# Patient Record
Sex: Male | Born: 1975 | Race: White | Hispanic: No | Marital: Single | Smoking: Current every day smoker
Health system: Southern US, Community
[De-identification: ages and names within clinical notes are randomized; demographics above are authoritative.]

## PROBLEM LIST (undated history)

## (undated) DIAGNOSIS — F909 Attention-deficit hyperactivity disorder, unspecified type: Secondary | ICD-10-CM

## (undated) DIAGNOSIS — Z902 Acquired absence of lung [part of]: Secondary | ICD-10-CM

## (undated) DIAGNOSIS — K469 Unspecified abdominal hernia without obstruction or gangrene: Secondary | ICD-10-CM

## (undated) HISTORY — PX: ANKLE SURGERY: SHX546

## (undated) HISTORY — PX: OTHER SURGICAL HISTORY: SHX169

---

## 1998-08-19 ENCOUNTER — Emergency Department (HOSPITAL_COMMUNITY): Admission: EM | Admit: 1998-08-19 | Discharge: 1998-08-19 | Payer: Self-pay | Admitting: Emergency Medicine

## 1999-03-31 ENCOUNTER — Emergency Department (HOSPITAL_COMMUNITY): Admission: EM | Admit: 1999-03-31 | Discharge: 1999-03-31 | Payer: Self-pay | Admitting: Emergency Medicine

## 1999-03-31 ENCOUNTER — Encounter: Payer: Self-pay | Admitting: Emergency Medicine

## 2001-07-25 ENCOUNTER — Emergency Department (HOSPITAL_COMMUNITY): Admission: EM | Admit: 2001-07-25 | Discharge: 2001-07-25 | Payer: Self-pay | Admitting: Emergency Medicine

## 2001-07-25 ENCOUNTER — Encounter: Payer: Self-pay | Admitting: Emergency Medicine

## 2002-08-13 ENCOUNTER — Emergency Department (HOSPITAL_COMMUNITY): Admission: EM | Admit: 2002-08-13 | Discharge: 2002-08-13 | Payer: Self-pay | Admitting: Emergency Medicine

## 2002-08-13 ENCOUNTER — Encounter: Payer: Self-pay | Admitting: Emergency Medicine

## 2002-08-13 ENCOUNTER — Emergency Department (HOSPITAL_COMMUNITY): Admission: AD | Admit: 2002-08-13 | Discharge: 2002-08-13 | Payer: Self-pay | Admitting: Emergency Medicine

## 2003-03-12 ENCOUNTER — Emergency Department (HOSPITAL_COMMUNITY): Admission: EM | Admit: 2003-03-12 | Discharge: 2003-03-12 | Payer: Self-pay | Admitting: Emergency Medicine

## 2003-07-29 ENCOUNTER — Emergency Department (HOSPITAL_COMMUNITY): Admission: EM | Admit: 2003-07-29 | Discharge: 2003-07-29 | Payer: Self-pay | Admitting: Emergency Medicine

## 2004-04-12 ENCOUNTER — Emergency Department (HOSPITAL_COMMUNITY): Admission: EM | Admit: 2004-04-12 | Discharge: 2004-04-12 | Payer: Self-pay | Admitting: Family Medicine

## 2008-09-03 ENCOUNTER — Emergency Department (HOSPITAL_COMMUNITY): Admission: EM | Admit: 2008-09-03 | Discharge: 2008-09-03 | Payer: Self-pay | Admitting: Emergency Medicine

## 2008-12-23 ENCOUNTER — Emergency Department (HOSPITAL_COMMUNITY): Admission: EM | Admit: 2008-12-23 | Discharge: 2008-12-23 | Payer: Self-pay | Admitting: Emergency Medicine

## 2009-02-02 ENCOUNTER — Emergency Department (HOSPITAL_COMMUNITY): Admission: EM | Admit: 2009-02-02 | Discharge: 2009-02-03 | Payer: Self-pay | Admitting: Emergency Medicine

## 2009-02-06 ENCOUNTER — Emergency Department (HOSPITAL_COMMUNITY): Admission: EM | Admit: 2009-02-06 | Discharge: 2009-02-06 | Payer: Self-pay | Admitting: Emergency Medicine

## 2009-03-15 ENCOUNTER — Emergency Department (HOSPITAL_COMMUNITY): Admission: EM | Admit: 2009-03-15 | Discharge: 2009-03-15 | Payer: Self-pay | Admitting: Family Medicine

## 2009-05-15 ENCOUNTER — Emergency Department (HOSPITAL_COMMUNITY): Admission: EM | Admit: 2009-05-15 | Discharge: 2009-05-15 | Payer: Self-pay | Admitting: Family Medicine

## 2009-08-31 ENCOUNTER — Emergency Department (HOSPITAL_COMMUNITY): Admission: EM | Admit: 2009-08-31 | Discharge: 2009-08-31 | Payer: Self-pay | Admitting: Family Medicine

## 2009-08-31 ENCOUNTER — Emergency Department (HOSPITAL_BASED_OUTPATIENT_CLINIC_OR_DEPARTMENT_OTHER): Admission: EM | Admit: 2009-08-31 | Discharge: 2009-08-31 | Payer: Self-pay | Admitting: Emergency Medicine

## 2010-04-05 LAB — CBC
Hemoglobin: 15 g/dL (ref 13.0–17.0)
MCV: 95.4 fL (ref 78.0–100.0)
RBC: 4.63 MIL/uL (ref 4.22–5.81)
WBC: 18.3 10*3/uL — ABNORMAL HIGH (ref 4.0–10.5)

## 2010-04-05 LAB — URINALYSIS, ROUTINE W REFLEX MICROSCOPIC
Bilirubin Urine: NEGATIVE
Glucose, UA: NEGATIVE mg/dL
Hgb urine dipstick: NEGATIVE
Ketones, ur: NEGATIVE mg/dL
Leukocytes, UA: NEGATIVE
Nitrite: NEGATIVE
Protein, ur: 30 mg/dL — AB
Specific Gravity, Urine: 1.023 (ref 1.005–1.030)
Urobilinogen, UA: 1 mg/dL (ref 0.0–1.0)
pH: 7 (ref 5.0–8.0)

## 2010-04-05 LAB — RAPID URINE DRUG SCREEN, HOSP PERFORMED: Barbiturates: NOT DETECTED

## 2010-04-05 LAB — DIFFERENTIAL
Basophils Absolute: 0.2 10*3/uL — ABNORMAL HIGH (ref 0.0–0.1)
Basophils Relative: 1 % (ref 0–1)
Eosinophils Absolute: 0 10*3/uL (ref 0.0–0.7)
Eosinophils Relative: 0 % (ref 0–5)

## 2010-04-05 LAB — COMPREHENSIVE METABOLIC PANEL
ALT: 14 U/L (ref 0–53)
AST: 23 U/L (ref 0–37)
CO2: 24 mEq/L (ref 19–32)
Chloride: 103 mEq/L (ref 96–112)
GFR calc Af Amer: >60 mL/min (ref >60)
GFR calc non Af Amer: >60 mL/min (ref >60)
Sodium: 136 mEq/L (ref 135–145)
Total Bilirubin: 0.2 mg/dL — ABNORMAL LOW (ref 0.3–1.2)

## 2010-04-05 LAB — URINE MICROSCOPIC-ADD ON

## 2010-04-05 LAB — ETHANOL: Alcohol, Ethyl (B): <5 mg/dL (ref 0–10)

## 2011-04-26 ENCOUNTER — Emergency Department (HOSPITAL_COMMUNITY): Payer: Medicaid Other

## 2011-04-26 ENCOUNTER — Encounter (HOSPITAL_COMMUNITY): Payer: Self-pay

## 2011-04-26 ENCOUNTER — Observation Stay (HOSPITAL_COMMUNITY)
Admission: EM | Admit: 2011-04-26 | Discharge: 2011-04-27 | Disposition: A | Discharge status: Home / Self Care | Payer: Medicaid Other | Attending: General Surgery | Admitting: General Surgery

## 2011-04-26 DIAGNOSIS — K4090 Unilateral inguinal hernia, without obstruction or gangrene, not specified as recurrent: Principal | ICD-10-CM | POA: Insufficient documentation

## 2011-04-26 HISTORY — DX: Unspecified abdominal hernia without obstruction or gangrene: K46.9

## 2011-04-26 HISTORY — DX: Acquired absence of lung (part of): Z90.2

## 2011-04-26 LAB — CBC
Hemoglobin: 16.3 g/dL (ref 13.0–17.0)
MCH: 31.7 pg (ref 26.0–34.0)
MCV: 91.8 fL (ref 78.0–100.0)
RBC: 5.15 MIL/uL (ref 4.22–5.81)

## 2011-04-26 LAB — DIFFERENTIAL
Basophils Absolute: 0.1 10*3/uL (ref 0.0–0.1)
Eosinophils Absolute: 0.2 10*3/uL (ref 0.0–0.7)
Eosinophils Relative: 2 % (ref 0–5)

## 2011-04-26 LAB — BASIC METABOLIC PANEL
Calcium: 9.9 mg/dL (ref 8.4–10.5)
GFR calc Af Amer: >90 mL/min (ref >90)
GFR calc non Af Amer: >90 mL/min (ref >90)
Sodium: 139 mEq/L (ref 135–145)

## 2011-04-26 MED ORDER — ENOXAPARIN SODIUM 40 MG/0.4ML ~~LOC~~ SOLN
40.0000 mg | SUBCUTANEOUS | Status: DC
  Administered 2011-04-26: 40 mg via SUBCUTANEOUS
  Filled 2011-04-26: qty 0.4

## 2011-04-26 MED ORDER — HYDROMORPHONE HCL PF 1 MG/ML IJ SOLN
1.0000 mg | Freq: Once | INTRAMUSCULAR | Status: DC
  Filled 2011-04-26: qty 1

## 2011-04-26 MED ORDER — NICOTINE 21 MG/24HR TD PT24
21.0000 mg | MEDICATED_PATCH | TRANSDERMAL | Status: DC
  Administered 2011-04-26: 21 mg via TRANSDERMAL
  Filled 2011-04-26: qty 1

## 2011-04-26 MED ORDER — KETOROLAC TROMETHAMINE 30 MG/ML IJ SOLN
30.0000 mg | Freq: Once | INTRAMUSCULAR | Status: AC
  Administered 2011-04-26: 30 mg via INTRAVENOUS
  Filled 2011-04-26: qty 1

## 2011-04-26 MED ORDER — SODIUM CHLORIDE 0.9 % IJ SOLN
INTRAMUSCULAR | Status: AC
  Administered 2011-04-26: 10 mL
  Filled 2011-04-26: qty 3

## 2011-04-26 MED ORDER — SODIUM CHLORIDE 0.9 % IV SOLN: INTRAVENOUS | Status: DC

## 2011-04-26 MED ORDER — CEFAZOLIN SODIUM 1-5 GM-% IV SOLN
1.0000 g | INTRAVENOUS | Status: DC
  Filled 2011-04-26: qty 50

## 2011-04-26 MED ORDER — SODIUM CHLORIDE 0.9 % IV BOLUS (SEPSIS): 500.0000 mL | Freq: Once | INTRAVENOUS | Status: DC

## 2011-04-26 MED ORDER — ONDANSETRON HCL 4 MG/2ML IJ SOLN
4.0000 mg | Freq: Once | INTRAMUSCULAR | Status: DC
  Filled 2011-04-26: qty 2

## 2011-04-26 NOTE — ED Provider Notes (Signed)
History  This chart was scribed for Donnetta Hutching, MD by Bennett Scrape. This patient was seen in room [facility]08/[facility]08 and the patient's care was started at 3:37PM.  CSN: [id number]  Arrival date & time 04/26/11  1507   First MD Initiated Contact with Patient 04/26/11 1528      Chief Complaint  Patient presents with  . Inguinal Hernia    The history is provided by the patient. No language interpreter was used.    Bradley White is a 36 y.o. male who presents to the Emergency Department complaining of 4 days of gradual onset, gradually worsening, constant pain and swelling to a left inguinal hernia. Pt states that he has had the inguinal hernia "all my life" but reports onset of pain and swelling 4 days getting ago.  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 He states that the pain is worse with touching or pressure. Nothing makes the pain better. He has not taken any medication for the pain PTA. He denies any other injuries or illnesses at this time. He denies fever, sore throat, cough, visual disturbance, abdominal pain, nausea, emesis, diarrhea, trouble urinating, dysuria, HA, chest pain and confusion as associated symptoms. 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 He has no other chronic medical conditions. He is a current everyday smoker but denies alcohol use.  Past Medical History  Diagnosis Date  . Hernia   . S/P lobectomy of lung     Past Surgical History  Procedure Date  . Lung removed     No family history on file.  History  Substance Use Topics  . Smoking status: Current Everyday Smoker  . Smokeless tobacco: Not on file  . Alcohol Use: No      Review of Systems A complete 10 system review of systems was obtained and all systems are negative except as noted in the HPI and PMH.   Allergies  Review of patient's allergies  indicates no known allergies.  Home Medications  No current outpatient prescriptions on file.  Triage Vitals: BP 118/71  Pulse 67  Temp(Src) 97.7 F (36.5 C) (Oral)  Resp 18  Ht 5\' 7"  (1.702 m)  Wt 134 lb (60.782 kg)  BMI 20.99 kg/m2  SpO2 98%  Physical Exam  Nursing note and vitals reviewed. Constitutional: He is oriented to person, place, and time. He appears well-developed and well-nourished.  HENT:  Head: Normocephalic and atraumatic.  Eyes: Conjunctivae and EOM are normal.  Neck: Normal range of motion. Neck supple.  Cardiovascular: Normal rate and regular rhythm.   Pulmonary/Chest: Effort normal and breath sounds normal.  Abdominal: Soft. There is no tenderness.  Genitourinary:       Significant incarcerated left inguinal hernia that is 8 by 6 by 6 cm, very tender  Musculoskeletal: Normal range of motion. He exhibits no edema.  Neurological: He is alert and oriented to person, place, and time.  Skin: Skin is warm and dry.  Psychiatric: He has a normal mood and affect. His behavior is normal.    ED Course  Procedures (including critical care time)  DIAGNOSTIC STUDIES: Oxygen Saturation is 98% on room air, normal by my interpretation.    COORDINATION OF CARE: 3:39PM-Dicussed need for consultation with surgeon and pt agreed to consultation. Discussed blood work, urine sample, abdominal x-ray and pain medication with pt and pt agreed to  plan.  Labs Reviewed - No data to display No results found.   No diagnosis found.    MDM  Patient has significant left inguinal hernia. Consult general surgery. Probable admission  I personally performed the services described in this documentation, which was scribed in my presence. The recorded information has been reviewed and considered.        Donnetta Hutching, MD 04/26/11 1626

## 2011-04-26 NOTE — ED Notes (Signed)
Pt remains in bed. Pt states that the hernia is still in. No complaints of pain at this time.

## 2011-04-26 NOTE — ED Notes (Signed)
Pt up to bathroom with no difficulty. Pt denies pain at this time.

## 2011-04-26 NOTE — ED Notes (Signed)
Dr. Cheryll Cockayne in to see patient. Hernia reduced by Dr. Cheryll Cockayne reduced hernia. Pt doesn't want IV or pain meds.

## 2011-04-26 NOTE — ED Notes (Signed)
Pt reports has left inguinal hernia and has been having increased pain and swelling for the past 4 days.

## 2011-04-26 NOTE — H&P (Signed)
Bradley White is an 36 y.o. male.   Chief Complaint: Protrusion of left inguinal hernia HPI: Patient presented to [facility] with less than 24 hours of protrusion of inguinal hernia. 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 He is noted to slowly increased in size of the hernia over the years. He has been told the past have it fixed but never proceeded with the repair. He has always been able to spontaneously reduce it or manually reduce it by himself. This time he has been unable to reduce it. He presented to the hospital with a large protrusion. No nausea or vomiting. No change in bowel movements. Last bowel movement was yesterday and was normal. No melena hematochezia. No diffuse dominant pain. No fevers or chills.  Past Medical History  Diagnosis Date  . Hernia   . S/P lobectomy of lung     Past Surgical History  Procedure Date  . Lung removed     No family history on file. Social History:  reports that he has been smoking.  He does not have any smokeless tobacco history on file. He reports that he does not drink alcohol or use illicit drugs.  Allergies: No Known Allergies  Medications Prior to Admission  Medication Dose Route Frequency Provider Last Rate Last Dose  . enoxaparin (LOVENOX) injection 40 mg  40 mg Subcutaneous Q24H Fabio Bering, MD      . DISCONTD: 0.9 %  sodium chloride infusion   Intravenous Continuous Donnetta Hutching, MD      . DISCONTD: HYDROmorphone (DILAUDID) injection 1 mg  1 mg Intravenous Once Donnetta Hutching, MD      . DISCONTD: ondansetron [facility]) injection 4 mg  4 mg Intravenous Once Donnetta Hutching, MD      . DISCONTD: sodium chloride 0.9 % bolus 500 mL  500 mL Intravenous Once Donnetta Hutching, MD       No current outpatient prescriptions on file as of 04/26/2011.    No results found for this or any previous visit (from the past 48 hour(s)). No results found.  Review of Systems  Constitutional: Negative.   HENT: Negative.   Eyes: Negative.   Respiratory:  Negative.   Cardiovascular: Negative.   Gastrointestinal: Positive for abdominal pain (tender left groin). Negative for heartburn, nausea and vomiting.       Patient states he has a known history of a left inguinal hernia. This pushed out yesterday was not able to reduce. He states spontaneous reduction in the past. He denies any nausea or vomiting.  Genitourinary: Negative.   Musculoskeletal: Negative.   Skin: Negative.   Neurological: Negative.   Endo/Heme/Allergies: Negative.   Psychiatric/Behavioral: Negative.     Blood pressure 118/71, pulse 67, temperature 97.7 F (36.5 C), temperature source Oral, resp. rate 18, height 5\' 7"  (1.702 m), weight 60.782 kg (134 lb), SpO2 98.00%. Physical Exam  Constitutional: He is oriented to person, place, and time. He appears well-developed and well-nourished. No distress.       Anxious  HENT:  Head: Normocephalic.  Eyes: Conjunctivae and EOM are normal. Pupils are equal, round, and reactive to light.  Neck: Normal range of motion. Neck supple. No tracheal deviation present. No thyromegaly present.  Cardiovascular: Normal rate, regular rhythm and normal heart sounds.   Respiratory: Effort normal and breath sounds normal. No respiratory distress. He has no wheezes.  GI: Soft. Bowel sounds are normal. He exhibits no distension and no mass. There is no tenderness. There is no  rebound and no guarding.       Large left inguinal hernia. Manually reduced by myself with constant pressure. No postreduction abdominal symptomatology.  Genitourinary:       Normal external male genitalia  Lymphadenopathy:    He has no cervical adenopathy.  Neurological: He is alert and oriented to person, place, and time.  Skin: Skin is warm and dry.     Assessment/Plan Left inguinal hernia. This is a large inguinal hernia. Again as mentioned above I was able to reduce it manually in the emergency department. Patient has been left in a Trendelenburg position and will  continue to allow for some bed rest to minimize recurrence overnight. I have discussed with the patient his options including discharge with future return for repair. Patient does wish to proceed as soon as possible and therefore I do feel it would be appropriate to watch the patient overnight in observation status especially given the current presentation. We'll continue with conservative management and we'll plan to proceed with a left inguinal hernia repair as an add-on tomorrow. His benefits and alternatives of repair were discussed.  Samin Milke C 04/26/2011, 4:21 PM

## 2011-04-26 NOTE — ED Notes (Signed)
Pt has large amount swelling to his left groin and testicle. Pt states that it has been swollen x 4 days. States that he has a hernia that goes in and out. Alert and oriented x 3. Skin warm and dry. Color pink. Denies problems with urination.

## 2011-04-27 ENCOUNTER — Encounter (HOSPITAL_COMMUNITY): Payer: Self-pay | Admitting: Anesthesiology

## 2011-04-27 ENCOUNTER — Inpatient Hospital Stay (HOSPITAL_COMMUNITY): Payer: Medicaid Other | Admitting: Anesthesiology

## 2011-04-27 ENCOUNTER — Encounter (HOSPITAL_COMMUNITY)
Admission: EM | Disposition: A | Discharge status: Home / Self Care | Payer: Self-pay | Source: Home / Self Care | Attending: Emergency Medicine

## 2011-04-27 ENCOUNTER — Encounter (HOSPITAL_COMMUNITY): Payer: Self-pay

## 2011-04-27 HISTORY — PX: INGUINAL HERNIA REPAIR: SHX194

## 2011-04-27 LAB — SURGICAL PCR SCREEN
MRSA, PCR: NEGATIVE
Staphylococcus aureus: NEGATIVE

## 2011-04-27 SURGERY — REPAIR, HERNIA, INGUINAL, ADULT
Anesthesia: General | Site: Abdomen | Laterality: Left | Wound class: Clean

## 2011-04-27 MED ORDER — FENTANYL CITRATE 0.05 MG/ML IJ SOLN
INTRAMUSCULAR | Status: AC
  Administered 2011-04-27: 50 ug via INTRAVENOUS
  Filled 2011-04-27: qty 2

## 2011-04-27 MED ORDER — OXYCODONE-ACETAMINOPHEN 7.5-325 MG PO TABS: 1.0000 | ORAL_TABLET | ORAL | Status: AC | PRN

## 2011-04-27 MED ORDER — BUPIVACAINE HCL (PF) 0.5 % IJ SOLN
INTRAMUSCULAR | Status: DC | PRN
  Administered 2011-04-27: 10 mL

## 2011-04-27 MED ORDER — CEFAZOLIN SODIUM 1-5 GM-% IV SOLN
INTRAVENOUS | Status: AC
  Filled 2011-04-27: qty 50

## 2011-04-27 MED ORDER — MIDAZOLAM HCL 2 MG/2ML IJ SOLN
INTRAMUSCULAR | Status: AC
  Filled 2011-04-27: qty 2

## 2011-04-27 MED ORDER — FENTANYL CITRATE 0.05 MG/ML IJ SOLN
INTRAMUSCULAR | Status: DC | PRN
  Administered 2011-04-27: 50 ug via INTRAVENOUS
  Administered 2011-04-27: 25 ug via INTRAVENOUS
  Administered 2011-04-27: 100 ug via INTRAVENOUS
  Administered 2011-04-27: 25 ug via INTRAVENOUS

## 2011-04-27 MED ORDER — BUPIVACAINE HCL (PF) 0.5 % IJ SOLN
INTRAMUSCULAR | Status: AC
  Filled 2011-04-27: qty 30

## 2011-04-27 MED ORDER — MIDAZOLAM HCL 2 MG/2ML IJ SOLN
1.0000 mg | INTRAMUSCULAR | Status: DC | PRN
  Administered 2011-04-27: 2 mg via INTRAVENOUS

## 2011-04-27 MED ORDER — MIDAZOLAM HCL 5 MG/5ML IJ SOLN
INTRAMUSCULAR | Status: DC | PRN
  Administered 2011-04-27: 2 mg via INTRAVENOUS

## 2011-04-27 MED ORDER — FENTANYL CITRATE 0.05 MG/ML IJ SOLN
25.0000 ug | INTRAMUSCULAR | Status: DC | PRN
  Administered 2011-04-27 (×2): 50 ug via INTRAVENOUS

## 2011-04-27 MED ORDER — FENTANYL CITRATE 0.05 MG/ML IJ SOLN
INTRAMUSCULAR | Status: AC
  Filled 2011-04-27: qty 2

## 2011-04-27 MED ORDER — PROPOFOL 10 MG/ML IV EMUL
INTRAVENOUS | Status: DC | PRN
  Administered 2011-04-27: 150 mg via INTRAVENOUS

## 2011-04-27 MED ORDER — OXYCODONE-ACETAMINOPHEN 5-325 MG PO TABS: 1.0000 | ORAL_TABLET | ORAL | Status: AC | PRN

## 2011-04-27 MED ORDER — CELECOXIB 100 MG PO CAPS
200.0000 mg | ORAL_CAPSULE | Freq: Two times a day (BID) | ORAL | Status: DC
  Filled 2011-04-27 (×4): qty 2

## 2011-04-27 MED ORDER — LACTATED RINGERS IV SOLN
INTRAVENOUS | Status: DC
  Administered 2011-04-27: 13:00:00 via INTRAVENOUS

## 2011-04-27 MED ORDER — ONDANSETRON HCL 4 MG/2ML IJ SOLN: 4.0000 mg | Freq: Once | INTRAMUSCULAR | Status: DC | PRN

## 2011-04-27 MED ORDER — LIDOCAINE HCL (CARDIAC) 10 MG/ML IV SOLN
INTRAVENOUS | Status: DC | PRN
  Administered 2011-04-27 (×2): 25 mg via INTRAVENOUS

## 2011-04-27 MED ORDER — CEFAZOLIN SODIUM 1-5 GM-% IV SOLN
INTRAVENOUS | Status: DC | PRN
  Administered 2011-04-27: 1 g via INTRAVENOUS

## 2011-04-27 MED ORDER — SODIUM CHLORIDE 0.9 % IR SOLN
Status: DC | PRN
  Administered 2011-04-27: 1000 mL

## 2011-04-27 MED ORDER — MIDAZOLAM HCL 2 MG/2ML IJ SOLN
INTRAMUSCULAR | Status: AC
  Administered 2011-04-27: 2 mg via INTRAVENOUS
  Filled 2011-04-27: qty 2

## 2011-04-27 MED ORDER — LIDOCAINE HCL (PF) 1 % IJ SOLN
INTRAMUSCULAR | Status: AC
  Filled 2011-04-27: qty 5

## 2011-04-27 MED ORDER — OXYCODONE-ACETAMINOPHEN 5-325 MG PO TABS: 1.0000 | ORAL_TABLET | ORAL | Status: DC | PRN

## 2011-04-27 MED ORDER — PROPOFOL 10 MG/ML IV EMUL
INTRAVENOUS | Status: AC
  Filled 2011-04-27: qty 20

## 2011-04-27 SURGICAL SUPPLY — 42 items
APPLIER CLIP 9.375 SM OPEN (CLIP) ×2
BAG HAMPER (MISCELLANEOUS) ×2 IMPLANT
BENZOIN TINCTURE PRP APPL 2/3 (GAUZE/BANDAGES/DRESSINGS) ×2 IMPLANT
CLIP APPLIE 9.375 SM OPEN (CLIP) ×1 IMPLANT
CLOTH BEACON ORANGE TIMEOUT ST (SAFETY) ×2 IMPLANT
COVER SURGICAL LIGHT HANDLE (MISCELLANEOUS) ×4 IMPLANT
DECANTER SPIKE VIAL GLASS SM (MISCELLANEOUS) ×2 IMPLANT
DRAIN PENROSE 18X.75 LTX STRL (MISCELLANEOUS) ×2 IMPLANT
DURAPREP 26ML APPLICATOR (WOUND CARE) ×2 IMPLANT
ELECT REM PT RETURN 9FT ADLT (ELECTROSURGICAL) ×2
ELECTRODE REM PT RTRN 9FT ADLT (ELECTROSURGICAL) ×1 IMPLANT
FORMALIN 10 PREFIL 120ML (MISCELLANEOUS) IMPLANT
GLOVE BIOGEL PI IND STRL 7.5 (GLOVE) ×1 IMPLANT
GLOVE BIOGEL PI INDICATOR 7.5 (GLOVE) ×1
GLOVE ECLIPSE 6.5 STRL STRAW (GLOVE) ×2 IMPLANT
GLOVE ECLIPSE 7.0 STRL STRAW (GLOVE) ×2 IMPLANT
GLOVE EXAM NITRILE MD LF STRL (GLOVE) ×2 IMPLANT
GLOVE INDICATOR 7.0 STRL GRN (GLOVE) ×4 IMPLANT
GLOVE SS BIOGEL STRL SZ 6.5 (GLOVE) ×1 IMPLANT
GLOVE SUPERSENSE BIOGEL SZ 6.5 (GLOVE) ×1
GOWN STRL REIN XL XLG (GOWN DISPOSABLE) ×6 IMPLANT
INST SET MINOR GENERAL (KITS) ×2 IMPLANT
KIT ROOM TURNOVER APOR (KITS) ×2 IMPLANT
MANIFOLD NEPTUNE II (INSTRUMENTS) ×2 IMPLANT
MESH MARLEX PLUG MEDIUM (Mesh General) ×2 IMPLANT
NEEDLE HYPO 25X1 1.5 SAFETY (NEEDLE) ×2 IMPLANT
NS IRRIG 1000ML POUR BTL (IV SOLUTION) ×2 IMPLANT
PACK MINOR (CUSTOM PROCEDURE TRAY) ×2 IMPLANT
PAD ARMBOARD 7.5X6 YLW CONV (MISCELLANEOUS) ×2 IMPLANT
SET BASIN LINEN APH (SET/KITS/TRAYS/PACK) ×2 IMPLANT
STRIP CLOSURE SKIN 1/2X4 (GAUZE/BANDAGES/DRESSINGS) ×2 IMPLANT
SUT ETHIBOND NAB MO 7 #0 18IN (SUTURE) ×2 IMPLANT
SUT MNCRL AB 4-0 PS2 18 (SUTURE) ×2 IMPLANT
SUT SILK 2 0 (SUTURE) ×1
SUT SILK 2-0 18XBRD TIE 12 (SUTURE) ×1 IMPLANT
SUT VIC AB 2-0 CT1 27 (SUTURE) ×1
SUT VIC AB 2-0 CT1 TAPERPNT 27 (SUTURE) ×1 IMPLANT
SUT VIC AB 3-0 SH 27 (SUTURE) ×1
SUT VIC AB 3-0 SH 27X BRD (SUTURE) ×1 IMPLANT
SUT VICRYL AB 3 0 TIES (SUTURE) IMPLANT
SYR BULB IRRIGATION 50ML (SYRINGE) ×2 IMPLANT
SYR CONTROL 10ML LL (SYRINGE) ×2 IMPLANT

## 2011-04-27 NOTE — Preoperative (Signed)
Beta Blockers   Reason not to administer Beta Blockers:Not Applicable 

## 2011-04-27 NOTE — Progress Notes (Signed)
Day of Surgery  Subjective: No acute change. New questions.  Objective: Vital signs in last 24 hours: Temp:  [97.7 F (36.5 C)-98.4 F (36.9 C)] 98.4 F (36.9 C) (04/10 1225) Pulse Rate:  [54-67] 63  (04/10 0621) Resp:  [16-18] 18  (04/10 1225) BP: (103-118)/(54-77) 109/72 mmHg (04/10 1225) SpO2:  [96 %-99 %] 96 % (04/10 1225) Weight:  [60.7 kg (133 lb 13.1 oz)-60.782 kg (134 lb)] 60.7 kg (133 lb 13.1 oz) (04/09 1747)    Intake/Output from previous day:   Intake/Output this shift:    General appearance: alert and no distress GI: soft, non-tender; bowel sounds normal; no masses,  no organomegaly Skin: Condylomatous appearing lesions in the groin but not directly in the surgical site.  Lab Results:   [facility] 04/26/11 1715  WBC 9.2  HGB 16.3  HCT 47.3  PLT 246   BMET  Basename 04/26/11 1715  NA 139  K 4.0  CL 104  CO2 27  GLUCOSE 91  BUN 7  CREATININE 0.72  CALCIUM 9.9   PT/INR No results found for this basename: LABPROT:2,INR:2 in the last 72 hours ABG No results found for this basename: PHART:2,PCO2:2,PO2:2,HCO3:2 in the last 72 hours  Studies/Results: Dg Abd Acute W/chest  04/26/2011  *RADIOLOGY REPORT*  Clinical Data: Inguinal hernia, prior collapsed lung  ACUTE ABDOMEN SERIES (ABDOMEN 2 VIEW & CHEST 1 VIEW)  Comparison: None.  Findings: Lungs are clear.  No pleural effusion or pneumothorax.  Cardiomediastinal silhouette is within normal limits.  Nonobstructive bowel gas pattern.  No evidence of free air under the diaphragm on the upright view.  Visualized osseous structures are within normal limits.  IMPRESSION: No evidence of acute cardiopulmonary disease.  No evidence of small bowel obstruction or free air.  Original Report Authenticated By: Charline Bills, M.D.    Anti-infectives: Anti-infectives     Start     Dose/Rate Route Frequency Ordered Stop   04/26/11 1735   ceFAZolin (ANCEF) IVPB 1 g/50 mL premix        1 g 100 mL/hr over 30 Minutes  Intravenous 60 min pre-op 04/26/11 1735            Assessment/Plan: s/p Procedure(s) (LRB): HERNIA REPAIR INGUINAL ADULT (Left) Plan to proceed with a left angle hernia repair. Risks benefits alternatives were discussed.  I expplained to patient why would not recommend proceeding with cauterization of the skin lesions at this time. I do feel this is in the neck we can continue to proceed as an outpatient. Patient understands and wishes to proceed with a hernia repair.  LOS: 1 day    Boniface Goffe C 04/27/2011

## 2011-04-27 NOTE — Progress Notes (Signed)
UR Chart Review Completed  

## 2011-04-27 NOTE — Anesthesia Procedure Notes (Signed)
Procedure Name: LMA Insertion Date/Time: 04/27/2011 1:19 PM Performed by: Despina Hidden Pre-anesthesia Checklist: Patient identified, Emergency Drugs available, Suction available and Patient being monitored Patient Re-evaluated:Patient Re-evaluated prior to inductionOxygen Delivery Method: Circle system utilized Preoxygenation: Pre-oxygenation with 100% oxygen Intubation Type: IV induction Ventilation: Mask ventilation without difficulty LMA Size: 4.0 Tube type: Oral Number of attempts: 1 Placement Confirmation: positive ETCO2 and breath sounds checked- equal and bilateral Tube secured with: Tape Dental Injury: Teeth and Oropharynx as per pre-operative assessment

## 2011-04-27 NOTE — Anesthesia Postprocedure Evaluation (Signed)
Anesthesia Post Note  Patient: Bradley White  Procedure(s) Performed: Procedure(s) (LRB): HERNIA REPAIR INGUINAL ADULT (Left)  Anesthesia type: General  Patient location: PACU  Post pain: Pain level controlled  Post assessment: Post-op Vital signs reviewed, Patient's Cardiovascular Status Stable, Respiratory Function Stable, Patent Airway, No signs of Nausea or vomiting and Pain level controlled  Last Vitals:  Filed Vitals:   04/27/11 1504  BP: 142/94  Pulse: 64  Temp: 36.6 C  Resp: 15    Post vital signs: Reviewed and stable  Level of consciousness: awake and alert   Complications: No apparent anesthesia complications

## 2011-04-27 NOTE — Anesthesia Preprocedure Evaluation (Signed)
Anesthesia Evaluation  Patient identified by MRN, date of birth, ID band Patient awake    Reviewed: Allergy & Precautions, H&P , NPO status , Patient's Chart, lab work & pertinent test results  History of Anesthesia Complications Negative for: history of anesthetic complications  Airway Mallampati: II      Dental  (+) Partial Upper   Pulmonary Current Smoker,  S/p lobectomy    Pulmonary exam normal       Cardiovascular negative cardio ROS  Rhythm:Regular     Neuro/Psych    GI/Hepatic (+)     substance abuse  marijuana use,   Endo/Other    Renal/GU      Musculoskeletal   Abdominal   Peds  Hematology   Anesthesia Other Findings   Reproductive/Obstetrics                           Anesthesia Physical Anesthesia Plan  ASA: II  Anesthesia Plan: General   Post-op Pain Management:    Induction: Intravenous  Airway Management Planned: LMA  Additional Equipment:   Intra-op Plan:   Post-operative Plan: Extubation in OR  Informed Consent: I have reviewed the patients History and Physical, chart, labs and discussed the procedure including the risks, benefits and alternatives for the proposed anesthesia with the patient or authorized representative who has indicated his/her understanding and acceptance.     Plan Discussed with:   Anesthesia Plan Comments:         Anesthesia Quick Evaluation  

## 2011-04-27 NOTE — Transfer of Care (Signed)
Immediate Anesthesia Transfer of Care Note  Patient: Bradley White  Procedure(s) Performed: Procedure(s) (LRB): HERNIA REPAIR INGUINAL ADULT (Left)  Patient Location: PACU  Anesthesia Type: General  Level of Consciousness: awake  Airway & Oxygen Therapy: Patient Spontanous Breathing and non-rebreather face mask  Post-op Assessment: Report given to PACU RN, Post -op Vital signs reviewed and stable and Patient moving all extremities  Post vital signs: Reviewed and stable  Complications: No apparent anesthesia complications

## 2011-04-28 NOTE — Addendum Note (Signed)
Addendum  created 04/28/11 1032 by Despina Hidden, CRNA   Modules edited:Notes Section

## 2011-04-28 NOTE — Anesthesia Postprocedure Evaluation (Signed)
  Anesthesia Post-op Note  Patient: Bradley White  Procedure(s) Performed: Procedure(s) (LRB): HERNIA REPAIR INGUINAL ADULT (Left)  Patient Location: room 301  Anesthesia Type: General  Level of Consciousness: awake, alert , oriented and patient cooperative  Airway and Oxygen Therapy: Patient Spontanous Breathing  Post-op Pain: none  Post-op Assessment: Post-op Vital signs reviewed, Patient's Cardiovascular Status Stable, Respiratory Function Stable, Patent Airway and Pain level controlled  Post-op Vital Signs: Reviewed and stable  Complications: No apparent anesthesia complications

## 2011-04-29 ENCOUNTER — Encounter (HOSPITAL_COMMUNITY): Payer: Self-pay | Admitting: General Surgery

## 2011-04-29 NOTE — Op Note (Signed)
Patient:  Bradley White  DOB:  April 05, 1975  MRN:  [id number]   Preop Diagnosis:  Left inguinal hernia  Postop Diagnosis:  The same  Procedure:  Left inguinal hernia repair with mesh  Surgeon:  Dr. Tilford Pillar  Anes:  General endotracheal, 0.5% Sensorcaine for local.  Indications:  Patient is a 71 male presented to [facility] emergency department on 04/27/2011 with a described incarcerated left angle hernia. As it popped out early in the morning the patient was unable to reduce it. He states in the past he has always been able to reduce except for this time. I was able to reduce in the emergency department. He states he had this hernia since he was born. It has slowly increased in size. And every 2 hours recent difficult reduction it is advised patient have a hernia repair. Risks benefits and alternatives of a inguinal hernia repair with mesh were discussed at length the patient including but not limited to the risk of bleeding, infection, infection and mesh requiring removal and subsequent repair, paresthesias, chronic pain, ischemic orchiditis, testicular loss, intraoperative cardiac and pulmonary events. Patient's questions and concerns were addressed the patient was consented for the planned procedure.  Procedure note:  Patient is taken to the operator's this a supine position the or table time the general anesthetic is a Optician, dispensing. Once patient was asleep he was endotracheally intubated by the nurse anesthetist. At this point his abdomen is prepped with DuraPrep solution and draped in standard fashion. A skin incision was created with a 10 blade scalpel with additional dissection down to subcuticular tissues carried out using electrocautery. This includes division of Scarpa's fascia. This dissection is carried out down to the external oblique fascia. This is scored with a 15 blade scalpel and opened medially to the external inguinal ring with Metzenbaum scissors. At this point the cord  structures and hernia sac were dissected off of the canal with blunt digital dissection. A window was created behind both the structures over the pubic tubercle with blunt digital dissection. A Penrose drain is placed to help elevate the cord structures out and into the surgical field. At this point I began dissecting the hernia sac off of the cord structures. Due to the size of the hernia sac it did opt to enter into the hernia sac. I did remove some of the excess hernia sac and disposed of this. Additionally I reduce a portion of colon that was pushed through the hernia defect. Prior to closing the peritoneal defect with a pursestring suture I noted the patient's apparent left testicle was adherent to the colon. The vascular supply was well-preserved an identifying the testicular vasculature and spermatic duct I carefully dissected the adherent tissue off of the colon thus freeing the testicle. This allowed enough mobilization to return the testicle back into the scrotum although the vascular supply limited complete return back into the scrotum. At this point the peritoneal defect was closed with a 0 Ethibond suture in a pursestring fashion. A medium mesh plug was inserted into the defect was secured with additional 0 Ethibond suture. The mesh onlay was brought to the field and was secured with 0 Ethibond suture circumferentially tacking the mesh on my medially over the pubic tubercle inferiorly to the shelving portion of the inguinal ligament, superiorly to the conjoin tendon, and laterally the keyhole defect was closed around the cord structures and the tail of the mesh tucked under the external the fascia. At this point did irrigate the field.  I was pleased with the appearance of the hernia repair this time turned attention to closure.  A 2-0 Vicryl sutures utilized reapproximate the external oblique fascia in a running continuous fashion. Local anesthetic was instilled at this time. A 3-0 Vicryl was utilized  to reapproximate Scarpa's fascia. A 4-0 Monocryl was utilized a running subcuticular suture to reapproximate the skin edges in running subcuticular suture. Skin was washed dried moist dry towel. Benzoin is applied around incision. Half-inch are suture placed. The drapes removed the patient was allowed to come out of general anesthetic was transferred back to the PACU in stable condition. At the conclusion of procedure all instrument, sponge, needle counts are correct. Patient tolerated procedure extremely well.  Complications:  None apparent  EBL:  Minimal  Specimen:  None

## 2011-05-09 ENCOUNTER — Emergency Department (HOSPITAL_COMMUNITY)
Admission: EM | Admit: 2011-05-09 | Discharge: 2011-05-09 | Discharge status: Against Medical Advice | Payer: Medicaid Other | Attending: Emergency Medicine | Admitting: Emergency Medicine

## 2011-05-09 ENCOUNTER — Encounter (HOSPITAL_COMMUNITY): Payer: Self-pay | Admitting: Emergency Medicine

## 2011-05-09 DIAGNOSIS — R109 Unspecified abdominal pain: Secondary | ICD-10-CM | POA: Insufficient documentation

## 2011-05-09 NOTE — ED Notes (Signed)
Pt had hernia repair x 2 weeks ago. Pt c/o n/v yesterday with bleeding from surgical site.

## 2011-05-09 NOTE — ED Notes (Signed)
Pt gown found on bed. Pt not in room X2. Went outfront and patient not found.

## 2011-05-09 NOTE — ED Provider Notes (Signed)
History     CSN: [id number]  Arrival date & time 05/09/11  4098   First MD Initiated Contact with Patient 05/09/11 1003      Chief Complaint  Patient presents with  . Post-op Problem    (Consider location/radiation/quality/duration/timing/severity/associated sxs/prior treatment) HPI  Past Medical History  Diagnosis Date  . Hernia   . S/P lobectomy of lung     Past Surgical History  Procedure Date  . Lung removed     left lung d/t  cysts per patient  . Inguinal hernia repair 04/27/2011    Procedure: HERNIA REPAIR INGUINAL ADULT;  Surgeon: Fabio Bering, MD;  Location: [facility];  Service: General;  Laterality: Left;  Left Inguinal Hernia Repair with Mesh Plug  . Ankle surgery     right    No family history on file.  History  Substance Use Topics  . Smoking status: Current Everyday Smoker -- 1.0 packs/day for 9 years    Types: Cigarettes  . Smokeless tobacco: Never Used  . Alcohol Use: No      Review of Systems  Allergies  Review of patient's allergies indicates no known allergies.  Home Medications   Current Outpatient Rx  Name Route Sig Dispense Refill  . OXYCODONE-ACETAMINOPHEN 7.5-325 MG PO TABS Oral Take 1-2 tablets by mouth every 4 (four) hours as needed.      BP 123/63  Pulse 64  Resp 18  Ht 5\' 8"  (1.727 m)  Wt 134 lb (60.782 kg)  BMI 20.37 kg/m2  SpO2 99%  Physical Exam  ED Course  Procedures (including critical care time)  Labs Reviewed - No data to display No results found.   1. Abdominal  pain, other specified site       MDM  Patient left AMA before being seen.        Nicoletta Dress. Colon Branch, MD 05/09/11 2223

## 2011-05-18 NOTE — Discharge Summary (Signed)
Physician Discharge Summary  Patient ID: Bradley White MRN: [id number] DOB/AGE: [birth year] 35 y.o.  Admit date: 04/26/2011 Discharge date: 04/27/2011  Admission Diagnoses: Left inguinal hernia  Discharge Diagnoses: Same Active Problems:  * No active hospital problems. *    Discharged Condition: stable  Hospital Course: Patient presented to [facility] with the inability to reduce a left inguinal hernia. At this initially by the emergency room physician were unsuccessful and surgical consultation was obtained. I was able to reduce the hernia in the emergency department however due to the difficulties with the reduction in the size of the hernia it was advised the patient stay for hernia repair. He is agreeable and patient was admitted for planned repair of a large inguinal hernia. He was taken to the operating room. He tolerated a left inguinal hernia repair with mesh without difficulties. In following the operation he was advanced on a diet and pain was controlled oral analgesia. Plans were made for discharge.  Consults: None  Significant Diagnostic Studies: labs: CBC and BMET  Treatments: surgery: Left inguinal hernia repair with mesh  Discharge Exam: Blood pressure 105/58, pulse 51, temperature 98.1 F (36.7 C), temperature source Oral, resp. rate 18, height 5\' 7"  (1.702 m), weight 60.7 kg (133 lb 13.1 oz), SpO2 91.00%. General appearance: alert and no distress Resp: clear to auscultation bilaterally Cardio: regular rate and rhythm GI: Positive bowel sounds, soft, flat, mild to moderate left inguinal tenderness as expected. No peritoneal signs. No masses or hernias.  Disposition: 01-Home or Self Care  Discharge Orders    Future Orders Please Complete By Expires   Diet - low sodium heart healthy      Increase activity slowly      Discharge instructions      Comments:   Increase activity as tolerated. May place ice pack for comfort.  Alternate an anti-inflammatory such  as ibuprofen (Motrin, Advil) 400-600mg  every 6 hours with the prescribed pain medication.   Do not take any additional acetaminophen as there is Tylenol in the pain medication.    Driving Restrictions      Comments:   No driving while on pain medications.    Lifting restrictions      Comments:   No lifting over 20lbs for 4-5 weeks post-op.    Discharge wound care:      Comments:   Clean surgical sites with soap and water.  May shower the morning after surgery unless instructed by Dr. Leticia Penna otherwise.  No soaking for 2-3 weeks.    If adhesive strips are in place, they may be removed in 1-2 weeks while in the shower.    Call MD for:  temperature >100.4      Call MD for:  persistant nausea and vomiting      Call MD for:  severe uncontrolled pain      Call MD for:  redness, tenderness, or signs of infection (pain, swelling, redness, odor or green/yellow discharge around incision site)        Medication List    Notice       You have not been prescribed any medications.            Follow-up Information    Follow up with Danella Philson C, MD in 3 weeks.   Contact information:   [address] [place] [address] [phone]          Signed: Fabio Bering 05/18/2011, 8:46 AM

## 2011-06-16 ENCOUNTER — Other Ambulatory Visit (HOSPITAL_COMMUNITY): Payer: Self-pay | Admitting: General Surgery

## 2011-06-16 DIAGNOSIS — N5089 Other specified disorders of the male genital organs: Secondary | ICD-10-CM

## 2011-06-17 ENCOUNTER — Other Ambulatory Visit (HOSPITAL_COMMUNITY): Payer: Self-pay | Admitting: General Surgery

## 2011-06-17 ENCOUNTER — Ambulatory Visit (HOSPITAL_COMMUNITY)
Admission: RE | Admit: 2011-06-17 | Discharge: 2011-06-17 | Disposition: A | Discharge status: Home / Self Care | Payer: Medicaid Other | Source: Ambulatory Visit | Attending: General Surgery | Admitting: General Surgery

## 2011-06-17 DIAGNOSIS — N5089 Other specified disorders of the male genital organs: Secondary | ICD-10-CM | POA: Insufficient documentation

## 2011-06-17 DIAGNOSIS — N433 Hydrocele, unspecified: Secondary | ICD-10-CM | POA: Insufficient documentation

## 2011-07-28 NOTE — H&P (Signed)
**Note Bradley White via Obfuscation** Bradley White is an 36 y.o. male.    Chief Complaint: Protrusion of left inguinal hernia HPI: Patient presented to [facility] with less than 24 hours of protrusion of inguinal hernia. 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 He is noted to slowly increased in size of the hernia over the years. He has been told the past have it fixed but never proceeded with the repair. He has always been able to spontaneously reduce it or manually reduce it by himself. This time he has been unable to reduce it. He presented to the hospital with a large protrusion. No nausea or vomiting. No change in bowel movements. Last bowel movement was yesterday and was normal. No melena hematochezia. No diffuse dominant pain. No fevers or chills.    Past Medical History   Diagnosis  Date   .  Hernia     .  S/P lobectomy of lung           Past Surgical History   Procedure  Date   .  Lung removed          No family history on file. Social History:  reports that he has been smoking.  He does not have any smokeless tobacco history on file. He reports that he does not drink alcohol or use illicit drugs.   Allergies: No Known Allergies    Medications Prior to Admission   Medication  Dose  Route  Frequency  Provider  Last Rate  Last Dose   .  enoxaparin (LOVENOX) injection 40 mg   40 mg  Subcutaneous  Q24H  Fabio Bering, MD         .  DISCONTD: 0.9 %  sodium chloride infusion     Intravenous  Continuous  Donnetta Hutching, MD         .  DISCONTD: HYDROmorphone (DILAUDID) injection 1 mg   1 mg  Intravenous  Once  Donnetta Hutching, MD         .  DISCONTD: ondansetron [facility]) injection 4 mg   4 mg  Intravenous  Once  Donnetta Hutching, MD         .  DISCONTD: sodium chloride 0.9 % bolus 500 mL   500 mL  Intravenous  Once  Donnetta Hutching, MD             No current outpatient prescriptions on file as of 04/26/2011.        No results found for this or any previous visit (from the past 48 hour(s)). No results  found.   Review of Systems  Constitutional: Negative.   HENT: Negative.   Eyes: Negative.   Respiratory: Negative.   Cardiovascular: Negative.   Gastrointestinal: Positive for abdominal pain (tender left groin). Negative for heartburn, nausea and vomiting.        Patient states he has a known history of a left inguinal hernia. This pushed out yesterday was not able to reduce. He states spontaneous reduction in the past. He denies any nausea or vomiting.  Genitourinary: Negative.   Musculoskeletal: Negative.   Skin: Negative.   Neurological: Negative.   Endo/Heme/Allergies: Negative.   Psychiatric/Behavioral: Negative.       Blood pressure 118/71, pulse 67, temperature 97.7 F (36.5 C), temperature source Oral, resp. rate 18, height 5\' 7"  (1.702 m), weight 60.782 kg (134 lb), SpO2 98.00%. Physical Exam  Constitutional: He is oriented to person, place, and time. He appears well-developed and well-nourished.  No distress.       Anxious  HENT:   Head: Normocephalic.  Eyes: Conjunctivae and EOM are normal. Pupils are equal, round, and reactive to light.  Neck: Normal range of motion. Neck supple. No tracheal deviation present. No thyromegaly present.  Cardiovascular: Normal rate, regular rhythm and normal heart sounds.   Respiratory: Effort normal and breath sounds normal. No respiratory distress. He has no wheezes.  GI: Soft. Bowel sounds are normal. He exhibits no distension and no mass. There is no tenderness. There is no rebound and no guarding.       Large left inguinal hernia. Manually reduced by myself with constant pressure. No postreduction abdominal symptomatology.  Genitourinary:       Normal external male genitalia  Lymphadenopathy:    He has no cervical adenopathy.  Neurological: He is alert and oriented to person, place, and time.  Skin: Skin is warm and dry.      Assessment/Plan Left inguinal hernia. This is a large inguinal hernia. Again as mentioned above I was  able to reduce it manually in the emergency department. Patient has been left in a Trendelenburg position and will continue to allow for some bed rest to minimize recurrence overnight. I have discussed with the patient his options including discharge with future return for repair. Patient does wish to proceed as soon as possible and therefore I do feel it would be appropriate to watch the patient overnight in observation status especially given the current presentation. We'll continue with conservative management and we'll plan to proceed with a left inguinal hernia repair as an add-on tomorrow. His benefits and alternatives of repair were discussed.   Taneasha Fuqua C                        Post Operative Follow Up - 07/28/2011  Patient Name: Bradley White Date of Birth: 28-Jul-1975  Vital Signs:  Insert Vitals as of: 07/26/2011: Systolic 125: Diastolic 81: Heart Rate 83: Temp 36.56C: Height 170CM: Weight 56.25KG: Pain Level 5: BMI 19   BMI: 19.42 kg/m2  Subjective: This 68 Years 32 Months old Male presents for surgery followup.  S/p [facility] repair. Patient has  has no  significant complaints. Still with burning pain.  Somewhat better but still limits his activities.  Minimal response to pain meds.  Patient states was not able to obtain an erection recently.  States he is not sure whether he has had one since his surgery.     Social History: ObtainedReviewed  Social History  Age: 62 Years 9 Months    Smoking Status:     Allergies:  Allergies Insert Code: No allergies found.     Objective: General: Well appearing, well nourished in no distress.illGeneral Complex Abnormalities Incision healing well.  Mesh is palpable.  No hernia.  Decreased sensation.     Assessment:   Diagnosis &amp; Procedure:    PO Final Path: PO Final Path:...   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  Will trial steroid injection.  Regarding  erectile dysfunction, will trial injection and if pain is not contributing will refer to Urology for further work-up.  Patient's repair was straight-forward and I have a low suspicion of pudenal nerve involment.    Follow-up:NTS F/U.Marland KitchenMarland KitchenPending Surgery

## 2011-07-29 ENCOUNTER — Encounter (HOSPITAL_COMMUNITY): Admission: RE | Payer: Self-pay | Source: Ambulatory Visit

## 2011-07-29 ENCOUNTER — Ambulatory Visit (HOSPITAL_COMMUNITY): Admission: RE | Admit: 2011-07-29 | Payer: Medicaid Other | Source: Ambulatory Visit | Admitting: General Surgery

## 2011-07-29 SURGERY — MINOR STEROID INJECTION
Anesthesia: LOCAL | Site: Groin | Laterality: Left

## 2011-07-29 NOTE — OR Nursing (Signed)
No show for procedure. Attempted to locate pt, unsuccessful.  Office notified, states" They haven't heard from him either".

## 2011-08-04 NOTE — H&P (Signed)
    Bradley White is an 36 y.o. male.    Chief Complaint: Protrusion of left inguinal hernia HPI: Patient presented to [facility] with less than 24 hours of protrusion of inguinal hernia. 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 He is noted to slowly increased in size of the hernia over the years. He has been told the past have it fixed but never proceeded with the repair. He has always been able to spontaneously reduce it or manually reduce it by himself. This time he has been unable to reduce it. He presented to the hospital with a large protrusion. No nausea or vomiting. No change in bowel movements. Last bowel movement was yesterday and was normal. No melena hematochezia. No diffuse dominant pain. No fevers or chills.    Past Medical History   Diagnosis  Date   .  Hernia     .  S/P lobectomy of lung           Past Surgical History   Procedure  Date   .  Lung removed          No family history on file. Social History:  reports that he has been smoking.  He does not have any smokeless tobacco history on file. He reports that he does not drink alcohol or use illicit drugs.   Allergies: No Known Allergies    Medications Prior to Admission   Medication  Dose  Route  Frequency  Provider  Last Rate  Last Dose   .  enoxaparin (LOVENOX) injection 40 mg   40 mg  Subcutaneous  Q24H  Aztlan Coll C Cayle Cordoba, MD         .  DISCONTD: 0.9 %  sodium chloride infusion     Intravenous  Continuous  Brian Cook, MD         .  DISCONTD: HYDROmorphone (DILAUDID) injection 1 mg   1 mg  Intravenous  Once  Brian Cook, MD         .  DISCONTD: ondansetron (ZOFRAN) injection 4 mg   4 mg  Intravenous  Once  Brian Cook, MD         .  DISCONTD: sodium chloride 0.9 % bolus 500 mL   500 mL  Intravenous  Once  Brian Cook, MD             No current outpatient prescriptions on file as of 04/26/2011.        No results found for this or any previous visit (from the past 48 hour(s)). No results  found.   Review of Systems  Constitutional: Negative.   HENT: Negative.   Eyes: Negative.   Respiratory: Negative.   Cardiovascular: Negative.   Gastrointestinal: Positive for abdominal pain (tender left groin). Negative for heartburn, nausea and vomiting.        Patient states he has a known history of a left inguinal hernia. This pushed out yesterday was not able to reduce. He states spontaneous reduction in the past. He denies any nausea or vomiting.  Genitourinary: Negative.   Musculoskeletal: Negative.   Skin: Negative.   Neurological: Negative.   Endo/Heme/Allergies: Negative.   Psychiatric/Behavioral: Negative.       Blood pressure 118/71, pulse 67, temperature 97.7 F (36.5 C), temperature source Oral, resp. rate 18, height 5' 7" (1.702 m), weight 60.782 kg (134 lb), SpO2 98.00%. Physical Exam  Constitutional: He is oriented to person, place, and time. He appears well-developed and well-nourished.   No distress.       Anxious  HENT:   Head: Normocephalic.  Eyes: Conjunctivae and EOM are normal. Pupils are equal, round, and reactive to light.  Neck: Normal range of motion. Neck supple. No tracheal deviation present. No thyromegaly present.  Cardiovascular: Normal rate, regular rhythm and normal heart sounds.   Respiratory: Effort normal and breath sounds normal. No respiratory distress. He has no wheezes.  GI: Soft. Bowel sounds are normal. He exhibits no distension and no mass. There is no tenderness. There is no rebound and no guarding.       Large left inguinal hernia. Manually reduced by myself with constant pressure. No postreduction abdominal symptomatology.  Genitourinary:       Normal external male genitalia  Lymphadenopathy:    He has no cervical adenopathy.  Neurological: He is alert and oriented to person, place, and time.  Skin: Skin is warm and dry.      Assessment/Plan Left inguinal hernia. This is a large inguinal hernia. Again as mentioned above I was  able to reduce it manually in the emergency department. Patient has been left in a Trendelenburg position and will continue to allow for some bed rest to minimize recurrence overnight. I have discussed with the patient his options including discharge with future return for repair. Patient does wish to proceed as soon as possible and therefore I do feel it would be appropriate to watch the patient overnight in observation status especially given the current presentation. We'll continue with conservative management and we'll plan to proceed with a left inguinal hernia repair as an add-on tomorrow. His benefits and alternatives of repair were discussed.   Laurali Goddard C                        Post Operative Follow Up - 07/28/2011  Patient Name: [name] Date of Birth: 07/04/1975  Vital Signs:  Insert Vitals as of: 07/26/2011: Systolic 125: Diastolic 81: Heart Rate 83: Temp 36.56C: Height 170CM: Weight 56.25KG: Pain Level 5: BMI 19   BMI: 19.42 kg/m2  Subjective: This 36 Years 9 Months old Male presents for surgery followup.  S/p [facility] repair. Patient has  has no  significant complaints. Still with burning pain.  Somewhat better but still limits his activities.  Minimal response to pain meds.  Patient states was not able to obtain an erection recently.  States he is not sure whether he has had one since his surgery.     Social History: ObtainedReviewed  Social History  Age: 35 Years 9 Months    Smoking Status:     Allergies:  Allergies Insert Code: No allergies found.     Objective: General: Well appearing, well nourished in no distress.illGeneral Complex Abnormalities Incision healing well.  Mesh is palpable.  No hernia.  Decreased sensation.     Assessment:   Diagnosis &amp; Procedure:    PO Final Path: PO Final Path:...   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  Will trial steroid injection.  Regarding  erectile dysfunction, will trial injection and if pain is not contributing will refer to Urology for further work-up.  Patient's repair was straight-forward and I have a low suspicion of pudenal nerve involment.    Follow-up:NTS F/U...Pending Surgery   

## 2011-08-05 ENCOUNTER — Encounter (HOSPITAL_COMMUNITY): Payer: Self-pay | Admitting: *Deleted

## 2011-08-05 ENCOUNTER — Ambulatory Visit (HOSPITAL_COMMUNITY)
Admission: RE | Admit: 2011-08-05 | Discharge: 2011-08-05 | Disposition: A | Discharge status: Home / Self Care | Payer: Medicaid Other | Source: Ambulatory Visit | Attending: General Surgery | Admitting: General Surgery

## 2011-08-05 ENCOUNTER — Encounter (HOSPITAL_COMMUNITY)
Admission: RE | Disposition: A | Discharge status: Home / Self Care | Payer: Self-pay | Source: Ambulatory Visit | Attending: General Surgery

## 2011-08-05 DIAGNOSIS — R1032 Left lower quadrant pain: Secondary | ICD-10-CM | POA: Insufficient documentation

## 2011-08-05 DIAGNOSIS — Z9889 Other specified postprocedural states: Secondary | ICD-10-CM | POA: Insufficient documentation

## 2011-08-05 DIAGNOSIS — G5780 Other specified mononeuropathies of unspecified lower limb: Secondary | ICD-10-CM

## 2011-08-05 HISTORY — PX: STERIOD INJECTION: SHX5046

## 2011-08-05 SURGERY — MINOR STEROID INJECTION
Anesthesia: LOCAL | Site: Groin

## 2011-08-05 MED ORDER — BUPIVACAINE HCL (PF) 0.5 % IJ SOLN
INTRAMUSCULAR | Status: AC
  Filled 2011-08-05: qty 30

## 2011-08-05 MED ORDER — TRIAMCINOLONE ACETONIDE 40 MG/ML IJ SUSP
40.0000 mg | Freq: Once | INTRAMUSCULAR | Status: DC
  Filled 2011-08-05: qty 1

## 2011-08-05 MED ORDER — HYDROCODONE-ACETAMINOPHEN 7.5-500 MG PO TABS: 1.0000 | ORAL_TABLET | ORAL | Status: AC | PRN

## 2011-08-05 MED ORDER — BUPIVACAINE HCL 0.5 % IJ SOLN
INTRAMUSCULAR | Status: DC | PRN
  Administered 2011-08-05: 5 mL

## 2011-08-05 MED ORDER — TRIAMCINOLONE ACETONIDE 40 MG/ML IJ SUSP
INTRAMUSCULAR | Status: DC | PRN
  Administered 2011-08-05: 40 mg via INTRAMUSCULAR

## 2011-08-05 NOTE — Progress Notes (Signed)
Pt. Verified & consented to procedure.  Pt. Tolerated steroid injection well.  Prescription for Percocet given to pt.  Pt. To make follow-up appt. With Dr. Leticia Penna in 2 weeks.

## 2011-08-05 NOTE — Op Note (Signed)
Patient:  Bradley White  DOB:  04-20-1975  MRN:  [id number]   Preop Diagnosis:  Left groin pain  Postop Diagnosis:  The same  Procedure:  Steroid injection.  Surgeon:  Dr. Tilford Pillar  Anes:  0.5% Marcaine  Indications:  Patient is a 36 year old male well-known to me after initial presentation with a large incarcerated inguinal hernia. After repair patient has had issues with chronic burning pain in this area. Initial postoperative pain did progress into this constant turning pain mostly with stretching and movement of the area. It is localized to the left inguinal and scrotal region. Possible etiologies were discussed with the patient at the office. He had limited response with her chronic pain medication or anti-inflammatories. Limited response to applications of ice. Risks benefits alternatives of a steroid injection in the area were discussed. His questions and concerns were addressed the patient was consented for planned procedure.  Procedure note:  Patient was kept in the preop holding area. The site was prepped with chlorhexidine solution. A 50-50 mixture of Kenalog 40 and 0.5% Marcaine plain was instilled into the left inguinal scar tissue. 10 mL as total of the mixture was instilled. Patient does state numbness in the area upon application. Vigorous massage was utilized. Patient tolerated procedure well. Band-Aid was placed over the injection site. Sharps are disposed of in proper accordance.  Complications:  None apparent  EBL:  None  Specimen:  None

## 2011-08-10 ENCOUNTER — Encounter (HOSPITAL_COMMUNITY): Payer: Self-pay | Admitting: General Surgery

## 2011-09-02 ENCOUNTER — Ambulatory Visit (HOSPITAL_COMMUNITY)
Admission: RE | Admit: 2011-09-02 | Discharge: 2011-09-02 | Disposition: A | Discharge status: Home / Self Care | Payer: Medicaid Other | Source: Ambulatory Visit | Attending: General Surgery | Admitting: General Surgery

## 2011-09-02 ENCOUNTER — Encounter (HOSPITAL_COMMUNITY)
Admission: RE | Disposition: A | Discharge status: Home / Self Care | Payer: Self-pay | Source: Ambulatory Visit | Attending: General Surgery

## 2011-09-02 ENCOUNTER — Encounter (HOSPITAL_COMMUNITY): Payer: Self-pay | Admitting: *Deleted

## 2011-09-02 DIAGNOSIS — R1032 Left lower quadrant pain: Secondary | ICD-10-CM | POA: Insufficient documentation

## 2011-09-02 HISTORY — PX: STERIOD INJECTION: SHX5046

## 2011-09-02 SURGERY — MINOR STEROID INJECTION
Anesthesia: LOCAL | Site: Groin

## 2011-09-02 MED ORDER — TRIAMCINOLONE ACETONIDE 40 MG/ML IJ SUSP: 200.0000 mg | Freq: Once | INTRAMUSCULAR | Status: DC

## 2011-09-02 MED ORDER — BUPIVACAINE HCL (PF) 0.5 % IJ SOLN
INTRAMUSCULAR | Status: AC
  Filled 2011-09-02: qty 30

## 2011-09-02 MED ORDER — OXYCODONE-ACETAMINOPHEN 7.5-500 MG PO TABS: 1.0000 | ORAL_TABLET | ORAL | Status: DC | PRN

## 2011-09-02 MED ORDER — TRIAMCINOLONE ACETONIDE 40 MG/ML IJ SUSP
40.0000 mg | Freq: Once | INTRAMUSCULAR | Status: DC
  Filled 2011-09-02: qty 1

## 2011-09-02 MED ORDER — TRIAMCINOLONE ACETONIDE 40 MG/ML IJ SUSP
INTRAMUSCULAR | Status: DC | PRN
  Administered 2011-09-02: 40 mg via INTRAMUSCULAR

## 2011-09-02 MED ORDER — BUPIVACAINE HCL 0.5 % IJ SOLN
INTRAMUSCULAR | Status: DC | PRN
  Administered 2011-09-02: 5 mL

## 2011-09-02 NOTE — H&P (Signed)
Bradley White is an 36 y.o. male.   Chief Complaint: Left groin pain HPI: Patient well known to me. Had about 1 week improvement in the left groin after injection last month.  Still with similar symptoms.   Past Medical History  Diagnosis Date  . Hernia   . S/P lobectomy of lung     Past Surgical History  Procedure Date  . Lung removed     left lung d/t  cysts per patient  . Inguinal hernia repair 04/27/2011    Procedure: HERNIA REPAIR INGUINAL ADULT;  Surgeon: Fabio Bering, MD;  Location: [facility];  Service: General;  Laterality: Left;  Left Inguinal Hernia Repair with Mesh Plug  . Ankle surgery     right  . Steriod injection 08/05/2011    Procedure: MINOR STEROID INJECTION;  Surgeon: Fabio Bering, MD;  Location: [facility];  Service: General;  Laterality: N/A;  In Minor Room    History reviewed. No pertinent family history. Social History:  reports that he has been smoking Cigarettes.  He has a 9 pack-year smoking history. He has never used smokeless tobacco. He reports that he uses illicit drugs (Marijuana) about 7 times per week. He reports that he does not drink alcohol.  Allergies: No Known Allergies  Medications Prior to Admission  Medication Sig Dispense Refill  . oxyCODONE-acetaminophen (PERCOCET) 7.5-500 MG per tablet Take 2 tablets by mouth every 4 (four) hours as needed.         No results found for this or any previous visit (from the past 48 hour(s)). No results found.  Review of Systems  Constitutional: Negative.   HENT: Negative.   Eyes: Negative.   Respiratory: Negative.   Cardiovascular: Negative.   Gastrointestinal: Negative.   Genitourinary:       Left groin pain  Musculoskeletal: Negative.   Skin: Negative.   Neurological: Negative.   Endo/Heme/Allergies: Negative.   Psychiatric/Behavioral: Negative.     There were no vitals taken for this visit. Physical Exam  Constitutional: He is oriented to person, place, and time. He appears well-developed  and well-nourished. No distress.  HENT:  Head: Normocephalic and atraumatic.  Eyes: Conjunctivae and EOM are normal. Pupils are equal, round, and reactive to light.  Neck: Normal range of motion. No thyromegaly present.  Cardiovascular: Normal rate, regular rhythm and normal heart sounds.   Respiratory: Effort normal and breath sounds normal. No respiratory distress.  GI: Soft. Bowel sounds are normal.       Mild left groin tenderness.  No masses.  Mesh is palpable due to thin body habitus.    Neurological: He is alert and oriented to person, place, and time.  Skin: Skin is warm and dry.     Assessment/Plan Nerve intrapment. Options discussed including surgical exploration .  Will proceed with injection.   Susa Bones C 09/02/2011, 8:19 AM

## 2011-09-02 NOTE — Progress Notes (Signed)
Pt. Verified & consented to procedure.  Pt. Tolerated procedure well. No complaints @ this time.  Prescription for percocet given.  Pt. To follow-up as needed with Dr. Leticia Penna.

## 2011-09-02 NOTE — Op Note (Signed)
Patient:  Bradley White  DOB:  1975/08/03  MRN:  [id number]   Preop Diagnosis:  Left groin pain  Postop Diagnosis:  The same  Procedure:  Steroid injection  Surgeon:  Dr. Tilford Pillar  Anes:  0.5% Marcaine plain  Indications:  Patient is a 35 grown L. well-known to me with a history of a previous left inguinal hernia repair with mesh. He has had left groin pain since the procedure with burning and electrical sensation down into the left groin and scrotal area. He has had a previous steroid injection which did give him approximately a week of relief of his symptomatology. Options were discussed including surgical exploration and potential revision versus repeat injection. Patient does wish to proceed with a repeat injection of steroid into the area. He is aware of possible recurrent symptomatology.  Procedure note:  Patient was taken to the minor room. His left groin was prepped with chlorhexidine solution. A 50-50 mixture of Kenalog and 0.5% Marcaine was injected into the incisional scar in the left groin. This was massaged vigorously for approximately 5 minutes for diffusion the steroids. Patient tolerated procedure well. A Band-Aid was placed as a dressing.  Sharps are disposed of in proper accordance.  Complications:  None apparent  EBL:  None  Specimen:  None

## 2011-09-04 ENCOUNTER — Encounter (HOSPITAL_COMMUNITY): Payer: Self-pay | Admitting: Anesthesiology

## 2011-09-06 ENCOUNTER — Encounter (HOSPITAL_COMMUNITY): Payer: Self-pay | Admitting: General Surgery

## 2011-10-18 ENCOUNTER — Encounter (HOSPITAL_COMMUNITY): Payer: Self-pay

## 2011-10-18 ENCOUNTER — Encounter (HOSPITAL_COMMUNITY)
Admission: RE | Admit: 2011-10-18 | Discharge: 2011-10-18 | Disposition: A | Discharge status: Home / Self Care | Payer: Medicaid Other | Source: Ambulatory Visit | Attending: General Surgery | Admitting: General Surgery

## 2011-10-18 LAB — CBC WITH DIFFERENTIAL/PLATELET
Basophils Absolute: 0 10*3/uL (ref 0.0–0.1)
Basophils Relative: 0 % (ref 0–1)
Eosinophils Absolute: 0.1 10*3/uL (ref 0.0–0.7)
Eosinophils Relative: 1 % (ref 0–5)
MCH: 32.5 pg (ref 26.0–34.0)
MCV: 94 fL (ref 78.0–100.0)
Platelets: 199 10*3/uL (ref 150–400)
RDW: 13.7 % (ref 11.5–15.5)

## 2011-10-18 LAB — BASIC METABOLIC PANEL
CO2: 30 mEq/L (ref 19–32)
Calcium: 9.8 mg/dL (ref 8.4–10.5)
Creatinine, Ser: 0.85 mg/dL (ref 0.50–1.35)
GFR calc non Af Amer: >90 mL/min (ref >90)
Glucose, Bld: 80 mg/dL (ref 70–99)

## 2011-10-18 LAB — SURGICAL PCR SCREEN: Staphylococcus aureus: POSITIVE — AB

## 2011-10-18 NOTE — Patient Instructions (Addendum)
   Your procedure is scheduled on: 10/21/2011  Report to [facility] at  615      AM.  Call this number if you have problems the morning of surgery: [phone]   Do not eat food or drink liquids :After Midnight.      Take these medicines the morning of surgery with A SIP OF WATER:percocet   Do not wear jewelry, make-up or nail polish.  Do not wear lotions, powders, or perfumes. You may wear deodorant.  Do not shave 48 hours prior to surgery.  Do not bring valuables to the hospital.  Contacts, dentures or bridgework may not be worn into surgery.  Leave suitcase in the car. After surgery it may be brought to your room.  For patients admitted to the hospital, checkout time is 11:00 AM the day of discharge.   Patients discharged the day of surgery will not be allowed to drive home.  :     Please read over the following fact sheets that you were given: Coughing and Deep Breathing, Surgical Site Infection Prevention, Anesthesia Post-op Instructions and Care and Recovery After Surgery     .PATIENT INSTRUCTIONS POST-ANESTHESIA  IMMEDIATELY FOLLOWING SURGERY:  Do not drive or operate machinery for the first twenty four hours after surgery.  Do not make any important decisions for twenty four hours after surgery or while taking narcotic pain medications or sedatives.  If you develop intractable nausea and vomiting or a severe headache please notify your doctor immediately.  FOLLOW-UP:  Please make an appointment with your surgeon as instructed. You do not need to follow up with anesthesia unless specifically instructed to do so.  WOUND CARE INSTRUCTIONS (if applicable):  Keep a dry clean dressing on the anesthesia/puncture wound site if there is drainage.  Once the wound has quit draining you may leave it open to air.  Generally you should leave the bandage intact for twenty four hours unless there is drainage.  If the epidural site drains for more than 36-48 hours please call the anesthesia  department.  QUESTIONS?:  Please feel free to call your physician or the hospital operator if you have any questions, and they will be happy to assist you.

## 2011-10-21 ENCOUNTER — Ambulatory Visit (HOSPITAL_COMMUNITY): Admission: RE | Admit: 2011-10-21 | Payer: Medicaid Other | Source: Ambulatory Visit | Admitting: General Surgery

## 2011-10-21 SURGERY — EXPLORATION, INGUINAL REGION
Anesthesia: General

## 2011-10-24 ENCOUNTER — Encounter (HOSPITAL_COMMUNITY): Payer: Self-pay | Admitting: Pharmacy Technician

## 2011-10-26 ENCOUNTER — Inpatient Hospital Stay (HOSPITAL_COMMUNITY): Admission: RE | Admit: 2011-10-26 | Discharge: 2011-10-26 | Payer: Medicaid Other | Source: Ambulatory Visit

## 2011-10-26 ENCOUNTER — Encounter (HOSPITAL_COMMUNITY): Payer: Self-pay

## 2011-10-31 ENCOUNTER — Ambulatory Visit (HOSPITAL_COMMUNITY): Payer: Medicaid Other | Admitting: Anesthesiology

## 2011-10-31 ENCOUNTER — Encounter (HOSPITAL_COMMUNITY): Payer: Self-pay | Admitting: Anesthesiology

## 2011-10-31 ENCOUNTER — Encounter (HOSPITAL_COMMUNITY)
Admission: RE | Disposition: A | Discharge status: Home / Self Care | Payer: Self-pay | Source: Ambulatory Visit | Attending: General Surgery

## 2011-10-31 ENCOUNTER — Encounter (HOSPITAL_COMMUNITY): Payer: Self-pay | Admitting: *Deleted

## 2011-10-31 ENCOUNTER — Ambulatory Visit (HOSPITAL_COMMUNITY)
Admission: RE | Admit: 2011-10-31 | Discharge: 2011-10-31 | Disposition: A | Discharge status: Home / Self Care | Payer: Medicaid Other | Source: Ambulatory Visit | Attending: General Surgery | Admitting: General Surgery

## 2011-10-31 DIAGNOSIS — Z01812 Encounter for preprocedural laboratory examination: Secondary | ICD-10-CM | POA: Insufficient documentation

## 2011-10-31 DIAGNOSIS — G5780 Other specified mononeuropathies of unspecified lower limb: Secondary | ICD-10-CM | POA: Insufficient documentation

## 2011-10-31 HISTORY — PX: GROIN DISSECTION: SHX5250

## 2011-10-31 SURGERY — EXPLORATION, INGUINAL REGION
Anesthesia: General | Site: Groin | Laterality: Left | Wound class: Clean

## 2011-10-31 MED ORDER — OXYCODONE-ACETAMINOPHEN 7.5-325 MG PO TABS: 1.0000 | ORAL_TABLET | ORAL | Status: DC | PRN

## 2011-10-31 MED ORDER — LACTATED RINGERS IV SOLN
INTRAVENOUS | Status: DC
  Administered 2011-10-31: 1000 mL via INTRAVENOUS

## 2011-10-31 MED ORDER — MIDAZOLAM HCL 2 MG/2ML IJ SOLN
INTRAMUSCULAR | Status: AC
  Filled 2011-10-31: qty 2

## 2011-10-31 MED ORDER — LIDOCAINE HCL (PF) 1 % IJ SOLN
INTRAMUSCULAR | Status: AC
  Filled 2011-10-31: qty 5

## 2011-10-31 MED ORDER — CELECOXIB 100 MG PO CAPS
400.0000 mg | ORAL_CAPSULE | Freq: Every day | ORAL | Status: AC
  Administered 2011-10-31: 400 mg via ORAL

## 2011-10-31 MED ORDER — MUPIROCIN 2 % EX OINT
TOPICAL_OINTMENT | Freq: Once | CUTANEOUS | Status: AC
  Administered 2011-10-31: 1 via NASAL

## 2011-10-31 MED ORDER — ENOXAPARIN SODIUM 40 MG/0.4ML ~~LOC~~ SOLN
SUBCUTANEOUS | Status: AC
  Filled 2011-10-31: qty 0.4

## 2011-10-31 MED ORDER — PROPOFOL 10 MG/ML IV EMUL
INTRAVENOUS | Status: AC
  Filled 2011-10-31: qty 20

## 2011-10-31 MED ORDER — FENTANYL CITRATE 0.05 MG/ML IJ SOLN
INTRAMUSCULAR | Status: AC
  Filled 2011-10-31: qty 2

## 2011-10-31 MED ORDER — FENTANYL CITRATE 0.05 MG/ML IJ SOLN: 25.0000 ug | INTRAMUSCULAR | Status: DC | PRN

## 2011-10-31 MED ORDER — FENTANYL CITRATE 0.05 MG/ML IJ SOLN
INTRAMUSCULAR | Status: DC | PRN
  Administered 2011-10-31: 50 ug via INTRAVENOUS
  Administered 2011-10-31 (×2): 25 ug via INTRAVENOUS

## 2011-10-31 MED ORDER — PROPOFOL 10 MG/ML IV EMUL
INTRAVENOUS | Status: DC | PRN
  Administered 2011-10-31: 150 mg via INTRAVENOUS

## 2011-10-31 MED ORDER — MUPIROCIN 2 % EX OINT
TOPICAL_OINTMENT | CUTANEOUS | Status: AC
  Filled 2011-10-31: qty 22

## 2011-10-31 MED ORDER — CEFAZOLIN SODIUM-DEXTROSE 2-3 GM-% IV SOLR
INTRAVENOUS | Status: DC | PRN
  Administered 2011-10-31: 2 g via INTRAVENOUS

## 2011-10-31 MED ORDER — LACTATED RINGERS IV SOLN
INTRAVENOUS | Status: DC | PRN
  Administered 2011-10-31 (×2): via INTRAVENOUS

## 2011-10-31 MED ORDER — ONDANSETRON HCL 4 MG/2ML IJ SOLN: 4.0000 mg | Freq: Once | INTRAMUSCULAR | Status: DC | PRN

## 2011-10-31 MED ORDER — CEFAZOLIN SODIUM-DEXTROSE 2-3 GM-% IV SOLR: 2.0000 g | INTRAVENOUS | Status: DC

## 2011-10-31 MED ORDER — MIDAZOLAM HCL 2 MG/2ML IJ SOLN
1.0000 mg | INTRAMUSCULAR | Status: DC | PRN
  Administered 2011-10-31: 2 mg via INTRAVENOUS

## 2011-10-31 MED ORDER — CELECOXIB 100 MG PO CAPS
ORAL_CAPSULE | ORAL | Status: AC
  Filled 2011-10-31: qty 4

## 2011-10-31 MED ORDER — BUPIVACAINE HCL (PF) 0.5 % IJ SOLN
INTRAMUSCULAR | Status: AC
  Filled 2011-10-31: qty 30

## 2011-10-31 MED ORDER — LIDOCAINE HCL (CARDIAC) 10 MG/ML IV SOLN
INTRAVENOUS | Status: DC | PRN
  Administered 2011-10-31: 50 mg via INTRAVENOUS

## 2011-10-31 MED ORDER — 0.9 % SODIUM CHLORIDE (POUR BTL) OPTIME
TOPICAL | Status: DC | PRN
  Administered 2011-10-31: 1000 mL

## 2011-10-31 MED ORDER — CHLORHEXIDINE GLUCONATE 4 % EX LIQD: 1.0000 "application " | Freq: Once | CUTANEOUS | Status: DC

## 2011-10-31 MED ORDER — ENOXAPARIN SODIUM 40 MG/0.4ML ~~LOC~~ SOLN
40.0000 mg | Freq: Once | SUBCUTANEOUS | Status: AC
  Administered 2011-10-31: 40 mg via SUBCUTANEOUS

## 2011-10-31 MED ORDER — CEFAZOLIN SODIUM-DEXTROSE 2-3 GM-% IV SOLR
INTRAVENOUS | Status: AC
  Filled 2011-10-31: qty 50

## 2011-10-31 MED ORDER — BUPIVACAINE HCL (PF) 0.5 % IJ SOLN
INTRAMUSCULAR | Status: DC | PRN
  Administered 2011-10-31: 10 mL

## 2011-10-31 MED ORDER — MIDAZOLAM HCL 5 MG/5ML IJ SOLN
INTRAMUSCULAR | Status: DC | PRN
  Administered 2011-10-31: 2 mg via INTRAVENOUS

## 2011-10-31 SURGICAL SUPPLY — 46 items
APPLIER CLIP 9.375 SM OPEN (CLIP) ×2
BAG HAMPER (MISCELLANEOUS) ×2 IMPLANT
BENZOIN TINCTURE PRP APPL 2/3 (GAUZE/BANDAGES/DRESSINGS) ×2 IMPLANT
CLIP APPLIE 9.375 SM OPEN (CLIP) ×1 IMPLANT
CLOTH BEACON ORANGE TIMEOUT ST (SAFETY) ×2 IMPLANT
COVER LIGHT HANDLE STERIS (MISCELLANEOUS) ×4 IMPLANT
DECANTER SPIKE VIAL GLASS SM (MISCELLANEOUS) ×2 IMPLANT
DRAIN PENROSE 18X.75 LTX STRL (MISCELLANEOUS) ×2 IMPLANT
DURAPREP 26ML APPLICATOR (WOUND CARE) ×2 IMPLANT
ELECT REM PT RETURN 9FT ADLT (ELECTROSURGICAL) ×2
ELECTRODE REM PT RTRN 9FT ADLT (ELECTROSURGICAL) ×1 IMPLANT
FORMALIN 10 PREFIL 120ML (MISCELLANEOUS) ×2 IMPLANT
GLOVE BIOGEL PI IND STRL 7.0 (GLOVE) ×3 IMPLANT
GLOVE BIOGEL PI IND STRL 7.5 (GLOVE) ×1 IMPLANT
GLOVE BIOGEL PI INDICATOR 7.0 (GLOVE) ×3
GLOVE BIOGEL PI INDICATOR 7.5 (GLOVE) ×1
GLOVE ECLIPSE 6.5 STRL STRAW (GLOVE) ×2 IMPLANT
GLOVE ECLIPSE 7.0 STRL STRAW (GLOVE) ×4 IMPLANT
GLOVE SS BIOGEL STRL SZ 6.5 (GLOVE) ×1 IMPLANT
GLOVE SUPERSENSE BIOGEL SZ 6.5 (GLOVE) ×1
GOWN STRL REIN XL XLG (GOWN DISPOSABLE) ×8 IMPLANT
INST SET MINOR GENERAL (KITS) ×2 IMPLANT
KIT ROOM TURNOVER APOR (KITS) ×2 IMPLANT
MANIFOLD NEPTUNE II (INSTRUMENTS) ×2 IMPLANT
MARKER SKIN DUAL TIP RULER LAB (MISCELLANEOUS) ×2 IMPLANT
NEEDLE HYPO 25X1 1.5 SAFETY (NEEDLE) ×2 IMPLANT
NS IRRIG 1000ML POUR BTL (IV SOLUTION) ×2 IMPLANT
PACK MINOR (CUSTOM PROCEDURE TRAY) ×2 IMPLANT
PAD ARMBOARD 7.5X6 YLW CONV (MISCELLANEOUS) ×2 IMPLANT
SET BASIN LINEN APH (SET/KITS/TRAYS/PACK) ×2 IMPLANT
SPONGE GAUZE 4X4 12PLY (GAUZE/BANDAGES/DRESSINGS) ×2 IMPLANT
STRIP CLOSURE SKIN 1/2X4 (GAUZE/BANDAGES/DRESSINGS) ×4 IMPLANT
SUT MNCRL AB 4-0 PS2 18 (SUTURE) ×2 IMPLANT
SUT NOVA NAB GS-22 2 2-0 T-19 (SUTURE) IMPLANT
SUT NOVAFIL NAB HGS22 2-0 30IN (SUTURE) IMPLANT
SUT SILK 2 0 (SUTURE) ×1
SUT SILK 2-0 18XBRD TIE 12 (SUTURE) ×1 IMPLANT
SUT VIC AB 2-0 CT1 27 (SUTURE) ×1
SUT VIC AB 2-0 CT1 TAPERPNT 27 (SUTURE) ×1 IMPLANT
SUT VIC AB 3-0 SH 27 (SUTURE) ×1
SUT VIC AB 3-0 SH 27X BRD (SUTURE) ×1 IMPLANT
SUT VICRYL AB 3 0 TIES (SUTURE) IMPLANT
SYR BULB IRRIGATION 50ML (SYRINGE) ×2 IMPLANT
SYR CONTROL 10ML LL (SYRINGE) ×2 IMPLANT
TAPE CLOTH SURG 4X10 WHT LF (GAUZE/BANDAGES/DRESSINGS) ×2 IMPLANT
TOWEL OR 17X26 4PK STRL BLUE (TOWEL DISPOSABLE) ×2 IMPLANT

## 2011-10-31 NOTE — Op Note (Signed)
Patient:  Bradley White  DOB:  1975/02/16  MRN:  [id number]   Preop Diagnosis:  Left inguinal nerve entrapment syndrome  Postop Diagnosis:   The same  Procedure:  Left inguinal exploration and nerve division  Surgeon:  Dr. Tilford Pillar  Anes:  General via laryngeal mask airway, 0.5% Sensorcaine plain for local  Indications:  Patient is a 36 year old male well-known to me with a history of left groin pain. He initially presented with an incarcerated large left inguinal hernia. This was repaired several months ago. Following the repair the patient developed significant pain in the left scrotum and left medial thigh. Symptoms are consistent with nerve entrapment. The risks benefits and alternatives of exploration as well as nerve division were discussed. His questions and concerns were addressed the patient is consented for the planned procedure.  Procedure note:  Patient was taken to the OR was placed in the supine position on the OR table.  At this point the general anesthetic was administered. Once the patient was asleep a larger mask airway was placed by the nurse anesthetist. At this point the patient's abdomen was prepped with DuraPrep solution including the patient's scrotum and penis. Drapes are placed in standard fashion. An elliptical incision was created over the previous incisional scar for excision of the scar tissue. Electrocautery was utilized to dissect down to subcuticular tissue including excision of several deposits of the previously injected steroid material. Dissection was carried down to the mesh. The mesh was easily identified. I then turned my attention medially and identified the cord structures as they were exiting the external inguinal ring. These were somewhat tethered with surrounding scar tissue and was able to carefully dissect around this area to free the testicle and allowed to descend further into the scrotum. Hemostasis was excellent at this time I did termite  attention to further inspection of the edges of the mesh. Mesh was noted to be in excellent position with no evidence of any separation from the surrounding tissue. There is no evidence of any recurrent her knee a. The lateral aspect of the mesh was identified and careful dissection identified the ilioinguinal nerve as it coursed towards the mesh. I dissected around the nerve. Small surgical clips were placed both distally and proximally on the nerve separated by approximately 1.5 cm. The nerve was then divided between the 2 clips. The portion of the nerve was sent as a permanent specimen. A second suspicious structure again suspicious for being I nerve was identified and was also divided in similar fashion both of these were sent as permanent specimens to pathology for confirmation of the nerve material. Additional exploration laterally did not demonstrate any other findings consistent with suspicious nerve material and at this time I turned my attention to closure.  A 2-0 Vicryl suture was utilized reapproximate the external oblique fascia in a running continuous fashion. The skin edges were reapproximated using a 4-0 Monocryl in a running subcuticular suture. The local anesthetic was instilled at this time. The skin was washed dried moist dry towel. Benzoin is applied around incision half-inch Steri-Strips are placed. The drapes removed patient was allowed to come out of general anesthetic and stretcher the PACU in stable condition. At the conclusion of the procedure all instrument, sponge, needle counts are correct. Patient tolerated procedure extremely well.  Complications:  None apparent  EBL:  Minimal  Specimen:  Nerve

## 2011-10-31 NOTE — H&P (Signed)
     Post Operative Follow Up - 09/14/2011  Patient Name: Bradley White Date of Birth: Aug 13, 1975  Vital Signs:  Insert Vitals as of: 09/13/2011: Systolic 127: Diastolic 85: Heart Rate 62: Temp 36.89C: Height 170CM: Weight 57.15KG: Pain Level 7: BMI 20   BMI: 19.73 kg/m2  Subjective: This 71 Years 44 Months old Male presents for surgery followup.  status post steroid injection Patient has    significant complaints.still complains of limited response. Complains of burning and tingling sensation in his left medial thigh and scrotum. This is exacerbated with excessive movements. Pain medication gives limited results.   Social History:   Social History      Not Available       Allergies:  Allergies Insert Code: No allergies found.     Objective: General: Well appearing, well nourished in no distress. Incision healing well. No palpable abnormalities. No palpable hernia. Mesh is easily palpable.   Assessment:     PO GNF:[id number] stable  Plan:  nerve entrapment. I had a long discussion with the patient regarding his continued symptomatology. At this time I do think that it is warranted to return to the operating room for an exploration of the groin. Reevaluation of the mesh and localization of the  ileo inguinal nerve. Will plan to divide the nerve at his time of his operation. I did discuss with the patient that this will result in permanent numbness and paresthesias of the left medial thigh and scrotum. I also did discuss with the patient chronic pain however at this time his symptomatology truly seems more as I nerve type of pain and should respond to division of the nerve. Surgery will be scheduled at his convenience.  Follow-up:Pending Surgery      Active Diagnosis and Procedures: V67.00 Follow-up examination following surgery, unspecified   99499 - UNLISTED EVALUATION AND MANAGEMENT SERVICE     Addendum:  Review and update of history and physical  (10/31/2011)  Patient is status post left incarcerated inguinal hernia repair with mesh. During his recovery is developed increasing burning and sharp pain within the left scrotum and thigh. His activity is been limited due to pain. Patient has had minimal response to steroid injection following the suspicion of nerve entrapment. Options were discussed with patient and patient wishes to proceed with exploration and division the nerve.  Past medical history: None,  condylomatous disease of the left groin  Past surgical history: As above, left inguinal hernia repair with mesh  Medications: Percocet  Allergies: No known drug allergies  Review of systems: Unremarkable  Family history: Noncontributory  Physical exam: Gen. thin healthy. HEENT: Pupils equal round reactive extraocular movements are intact. No scleral icterus. Cardiovascular: Bradycardia rate rhythm. Pulmonary: Clear to auscultation bilaterally. Unlabored respirations. GI: Positive bowel sounds abdomen soft and flat. Mild tenderness in the left groin. Mesh is palpable. No hernia is apparent. Extremities: Atraumatic, and no abnormalities noted.  Assessment /Plan: Nerve entrapment as above. Plan to proceed with surgical expiration the growing and possible revision. Plan division of the nerve. Risks benefits alternatives were discussed with patient. Possibility of chronic pain is also discussed with patient. Possible courses following intervention were discussed. Patient's questions and concerns were addressed again.

## 2011-10-31 NOTE — Anesthesia Preprocedure Evaluation (Signed)
Anesthesia Evaluation  Patient identified by MRN, date of birth, ID band Patient awake    Reviewed: Allergy & Precautions, H&P , NPO status , Patient's Chart, lab work & pertinent test results  History of Anesthesia Complications Negative for: history of anesthetic complications  Airway Mallampati: II      Dental  (+) Partial Upper   Pulmonary Current Smoker,  S/p lobectomy    Pulmonary exam normal       Cardiovascular negative cardio ROS  Rhythm:Regular     Neuro/Psych    GI/Hepatic (+)     substance abuse  marijuana use,   Endo/Other    Renal/GU      Musculoskeletal   Abdominal   Peds  Hematology   Anesthesia Other Findings   Reproductive/Obstetrics                           Anesthesia Physical Anesthesia Plan  ASA: II  Anesthesia Plan: General   Post-op Pain Management:    Induction: Intravenous  Airway Management Planned: LMA  Additional Equipment:   Intra-op Plan:   Post-operative Plan: Extubation in OR  Informed Consent: I have reviewed the patients History and Physical, chart, labs and discussed the procedure including the risks, benefits and alternatives for the proposed anesthesia with the patient or authorized representative who has indicated his/her understanding and acceptance.     Plan Discussed with:   Anesthesia Plan Comments:         Anesthesia Quick Evaluation

## 2011-10-31 NOTE — Anesthesia Procedure Notes (Signed)
Procedure Name: LMA Insertion Date/Time: 10/31/2011 8:20 AM Performed by: Despina Hidden Pre-anesthesia Checklist: Patient identified, Patient being monitored, Emergency Drugs available and Suction available Patient Re-evaluated:Patient Re-evaluated prior to inductionOxygen Delivery Method: Circle system utilized Intubation Type: IV induction Ventilation: Mask ventilation without difficulty LMA Size: 4.0 Tube type: Oral Number of attempts: 2 Placement Confirmation: breath sounds checked- equal and bilateral and positive ETCO2 Tube secured with: Tape Dental Injury: Teeth and Oropharynx as per pre-operative assessment

## 2011-10-31 NOTE — Transfer of Care (Signed)
  Anesthesia Post-op Note  Patient: Bradley White  Procedure(s) Performed: Procedure(s) (LRB) with comments: GROIN EXPLORATION (Left)  Patient Location: PACU  Anesthesia Type: General  Level of Consciousness: awake, alert , oriented and patient cooperative  Airway and Oxygen Therapy: Patient Spontanous Breathing and Patient connected to face mask oxygen  Post-op Pain: mild  Post-op Assessment: Post-op Vital signs reviewed, Patient's Cardiovascular Status Stable, Respiratory Function Stable, Patent Airway and No signs of Nausea or vomiting  Post-op Vital Signs: Reviewed and stable  Complications: No apparent anesthesia complications

## 2011-10-31 NOTE — Progress Notes (Signed)
1120 Pt bleeding at operative site.  Pressure dressing applied to left groin.  Dr.  Leticia Penna notified and in to see pt.  New pressure dressing applied and pt no longer bleeding at site.  Pt instructed that he may have some oozing of blood with movement but if he saturates a dressing every couple of hours then he needs to call the doctor.

## 2011-10-31 NOTE — Interval H&P Note (Signed)
History and Physical Interval Note:  10/31/2011 7:55 AM  Bradley White  has presented today for surgery, with the diagnosis of nerve entrapment  The various methods of treatment have been discussed with the patient and family. After consideration of risks, benefits and other options for treatment, the patient has consented to  Procedure(s) (LRB) with comments: GROIN EXPLORATION (Left) as a surgical intervention .  The patient's history has been reviewed, patient examined, no change in status, stable for surgery.  I have reviewed the patient's chart and labs.  Questions were answered to the patient's satisfaction.     Havoc Sanluis C

## 2011-10-31 NOTE — Anesthesia Postprocedure Evaluation (Signed)
  Anesthesia Post-op Note  Patient: Bradley White  Procedure(s) Performed: Procedure(s) (LRB) with comments: GROIN EXPLORATION (Left)  Patient Location: PACU  Anesthesia Type: General  Level of Consciousness: awake, alert , oriented and patient cooperative  Airway and Oxygen Therapy: Patient Spontanous Breathing and Patient connected to face mask oxygen  Post-op Pain: mild  Post-op Assessment: Post-op Vital signs reviewed, Patient's Cardiovascular Status Stable, Respiratory Function Stable, Patent Airway and No signs of Nausea or vomiting  Post-op Vital Signs: Reviewed and stable  Complications: No apparent anesthesia complications  

## 2011-10-31 NOTE — Preoperative (Signed)
Beta Blockers   Reason not to administer Beta Blockers:Not Applicable 

## 2011-11-03 ENCOUNTER — Encounter (HOSPITAL_COMMUNITY): Payer: Self-pay | Admitting: General Surgery

## 2011-11-09 MED ORDER — BUPIVACAINE HCL (PF) 0.5 % IJ SOLN
INTRAMUSCULAR | Status: AC
  Filled 2011-11-09: qty 30

## 2011-12-29 ENCOUNTER — Other Ambulatory Visit (HOSPITAL_COMMUNITY): Payer: Self-pay | Admitting: General Surgery

## 2011-12-29 DIAGNOSIS — N50812 Left testicular pain: Secondary | ICD-10-CM

## 2011-12-30 ENCOUNTER — Ambulatory Visit (HOSPITAL_COMMUNITY)
Admission: RE | Admit: 2011-12-30 | Discharge: 2011-12-30 | Disposition: A | Discharge status: Home / Self Care | Payer: Medicaid Other | Source: Ambulatory Visit | Attending: General Surgery | Admitting: General Surgery

## 2011-12-30 ENCOUNTER — Other Ambulatory Visit (HOSPITAL_COMMUNITY): Payer: Self-pay | Admitting: General Surgery

## 2011-12-30 DIAGNOSIS — N50812 Left testicular pain: Secondary | ICD-10-CM

## 2011-12-30 DIAGNOSIS — N433 Hydrocele, unspecified: Secondary | ICD-10-CM | POA: Insufficient documentation

## 2011-12-30 DIAGNOSIS — N509 Disorder of male genital organs, unspecified: Secondary | ICD-10-CM | POA: Insufficient documentation

## 2012-09-03 ENCOUNTER — Emergency Department (INDEPENDENT_AMBULATORY_CARE_PROVIDER_SITE_OTHER)
Admission: EM | Admit: 2012-09-03 | Discharge: 2012-09-03 | Disposition: A | Discharge status: Home / Self Care | Payer: Medicaid Other | Source: Home / Self Care | Attending: Emergency Medicine | Admitting: Emergency Medicine

## 2012-09-03 ENCOUNTER — Encounter (HOSPITAL_COMMUNITY): Payer: Self-pay

## 2012-09-03 DIAGNOSIS — L98491 Non-pressure chronic ulcer of skin of other sites limited to breakdown of skin: Secondary | ICD-10-CM

## 2012-09-03 DIAGNOSIS — F419 Anxiety disorder, unspecified: Secondary | ICD-10-CM

## 2012-09-03 DIAGNOSIS — J069 Acute upper respiratory infection, unspecified: Secondary | ICD-10-CM

## 2012-09-03 DIAGNOSIS — F411 Generalized anxiety disorder: Secondary | ICD-10-CM

## 2012-09-03 MED ORDER — MELOXICAM 15 MG PO TABS: 15.0000 mg | ORAL_TABLET | Freq: Every day | ORAL | Status: DC

## 2012-09-03 MED ORDER — BUSPIRONE HCL 15 MG PO TABS: 7.5000 mg | ORAL_TABLET | Freq: Three times a day (TID) | ORAL | Status: DC

## 2012-09-03 MED ORDER — MUPIROCIN 2 % EX OINT: TOPICAL_OINTMENT | Freq: Three times a day (TID) | CUTANEOUS | Status: DC

## 2012-09-03 MED ORDER — SULFAMETHOXAZOLE-TMP DS 800-160 MG PO TABS: 2.0000 | ORAL_TABLET | Freq: Two times a day (BID) | ORAL | Status: DC

## 2012-09-03 MED ORDER — FEXOFENADINE-PSEUDOEPHED ER 60-120 MG PO TB12: 1.0000 | ORAL_TABLET | Freq: Two times a day (BID) | ORAL | Status: DC

## 2012-09-03 NOTE — ED Notes (Signed)
C/o uri type syx (general body aches, cough, stuffiness) onset 6-12 H after spider bite ( has picture of spider)

## 2012-09-03 NOTE — ED Provider Notes (Signed)
Chief Complaint:   Chief Complaint  Patient presents with  . URI    History of Present Illness:   Bradley White is a 37 year old male who comes in today because of stress and anxiety. He wants refills on Percocet and Valium. The patient states is a single parent of a 7-year-old child. He's on disability because of having to take care of his child. He states she's been sick for the past 3 days and can't sleep or eat. He has nasal congestion, rhinorrhea, abdominal pain, headache, fever, cough, nausea, vomiting. The patient states he's had a hernia repair about 5 months ago but on by Dr. Leticia Penna. There is still sore. Dr. Leticia Penna had been giving him Percocet and Valium, but he stopped these meds in a week ago. He states the hernia has not healed up yet. He denies any depression, thoughts of suicide, thoughts of harming himself or anyone else. He denies use of alcohol, he does smoke marijuana, and also smokes cigarettes. He's also had a couple of spider bites on his abdomen. He was able to show me a picture the spider it looks like old spider. These are tender to touch.  Review of Systems:  Other than noted above, the patient denies any of the following symptoms: Systemic:  No fever, chills, sweats, fatigue, weight loss or gain. Resp:  No shortness of breath. Cardiovasc:  No chest pain, tightness, pressure, palpitations, dizziness, or syncope. GI:  No abdominal pain, nausea, vomiting, anorexia, diarrhea, or constipation. Neuro:  No headache, paresthesias, tremor, or muscle weakness. Psych:  No sadness, depression, crying, anxiety, panic, sleep disturbance, or suicidal or homicidal ideation.  No hallucinations or delusions.  PMFSH:  Past medical history, family history, social history, meds, and allergies were reviewed.    Physical Exam:   Vital signs:  BP 120/60  Pulse 57  Temp(Src) 97.8 F (36.6 C) (Oral)  Resp 10  SpO2 97% Gen:  Alert, oriented, appears nervous and anxious. Lungs:  No  respiratory distress.  Breath sounds clear and equal bilaterally.  No wheezes, rales, or rhonchi. Heart:  Regular rthythm.  No gallops, murmers, clicks or rubs. Abdomen:  Soft, flat and nontender.  No organomegaly or mass. He has a hernia repair in his left groin. This shows some skin atrophy, and a tiny ulceration the middle. There is no erythema, induration, or purulent drainage. Neuro:  Alert and oriented times 3. Speech clear, fluent and appropriate.  Cranial nerves intact.  No focal weakness. Psych:  Mood and affect normal.  Speech pattern normal.  Thought content normal with no suicidal or homicidal ideation.  No paranoia, hallucinations, or delusions.  Memory, insight, and judgement normal. Skin: He has 2 small erythematous papules on his right abdomen. These were tender to touch and not draining any pus.  Assessment:  The primary encounter diagnosis was Anxiety. Diagnoses of Viral URI, Spider bite, initial encounter, and Non-healing ulcer, limited to breakdown of skin were also pertinent to this visit.   May be going through drug withdrawal secondary to being on Percocet and Valium and now having his medication cut off. I advised him to followup at the [facility] tomorrow. He was given some BuSpar for anxiety, Allegra for the nasal congestion, Mobic for pain, and is to apply Bactroban to the ulcerated area and also trimethoprim sulfa for possible infection.  Plan:   1.  The following meds were prescribed:   Discharge Medication List as of 09/03/2012 11:36 AM    START taking these medications  Details  busPIRone (BUSPAR) 15 MG tablet Take 0.5 tablets (7.5 mg total) by mouth 3 (three) times daily., Starting 09/03/2012, Until Discontinued, Normal    fexofenadine-pseudoephedrine (ALLEGRA-D) 60-120 MG per tablet Take 1 tablet by mouth every 12 (twelve) hours., Starting 09/03/2012, Until Discontinued, Normal    meloxicam (MOBIC) 15 MG tablet Take 1 tablet (15 mg total) by mouth daily., Starting  09/03/2012, Until Discontinued, Normal    mupirocin ointment (BACTROBAN) 2 % Apply topically 3 (three) times daily., Starting 09/03/2012, Until Discontinued, Normal    sulfamethoxazole-trimethoprim (BACTRIM DS) 800-160 MG per tablet Take 2 tablets by mouth 2 (two) times daily., Starting 09/03/2012, Until Discontinued, Normal       2.  The patient was instructed in symptomatic care and handouts were given. 3.  The patient was told to return if becoming worse in any way, if no better in 3 or 4 days, and given some red flag symptoms including worsening symptoms or suicidal ideation that would indicate earlier return. 4.  Follow up at [facility] and with Dr. Leticia Penna.    Reuben Likes, MD 09/03/12 [phone]

## 2014-10-21 NOTE — H&P (Signed)
  Patient Name: Bradley White Date of Birth: August 26, 1975  Vital Signs:  Vitals as of: 10/21/2014: Systolic 128: Diastolic 61: Heart Rate 76: Temp 37.28C: Height 170CM: Weight 58.06KG: Pain Level 8: BMI 20.05   BMI: 20.05 kg/m2  Subjective: This 39 year old male presents for wound followup.  Still having pain and drainage at left groin site. Patient   significant complaints.   Social History: Reviewed  Social History      Not Available   Psurg:  Left inguinal herniorrhaphy with mesh in past    Meds:  Percocet, valium Allergies: No allergies and no drug allergies found.     Objective: General: Well appearing, well nourished in no distress Lungs:  CTA Cor:  RRR without s3, s4, murmurs Abdomen:  Soft, nondistended.  No HSM, hernias. Incision in left groin with tented skin and small draining sinus.     Assessment: Suture granuloma, left groin  Diagnoses: 998.89  T81.590S Suture granuloma (Other complications of foreign body accidentally left in body following surgical operation, sequela)  Procedures: 16109 - OFFICE OUTPATIENT VISIT 10 MINUTES     Plan: Scheduled for excision of suture granuloma on 11/19/14.   Follow-up:Pending Surgery

## 2014-11-13 NOTE — Patient Instructions (Signed)
    Bradley BattyJoseph D White  11/13/2014     @PREFPERIOPPHARMACY @   Your procedure is scheduled on 11/19/2014.  [facility]  Call this number if you have problems the morning of surgery:  [phone]   Remember:  Do not eat food or drink liquids after midnight.  Take these medicines the morning of surgery with A SIP OF WATER:  percocet   Do not wear jewelry, make-up or nail polish.  Do not wear lotions, powders, or perfumes.  You may wear deodorant.  Do not shave 48 hours prior to surgery.  Men may shave face and neck.  Do not bring valuables to the hospital.  [facility] belongings or [facility]ables.  Contacts, dentures or bridgework may not be worn into surgery.  Leave your suitcase in the car.  After surgery it may be brought to your room.  For patients admitted to the hospital, discharge time will be determined by your treatment team.  Patients discharged the day of surgery will not be allowed to drive home.   Name and phone number of your driver:   family Special instructions:  n/a  Please read over the following fact sheets that you were given. Care and Recovery After Surgery

## 2014-11-14 ENCOUNTER — Encounter (HOSPITAL_COMMUNITY): Payer: Self-pay

## 2014-11-14 ENCOUNTER — Encounter (HOSPITAL_COMMUNITY)
Admission: RE | Admit: 2014-11-14 | Discharge: 2014-11-14 | Disposition: A | Discharge status: Home / Self Care | Payer: Medicaid Other | Source: Ambulatory Visit | Attending: General Surgery | Admitting: General Surgery

## 2014-11-14 DIAGNOSIS — Z01818 Encounter for other preprocedural examination: Secondary | ICD-10-CM | POA: Diagnosis not present

## 2014-11-14 DIAGNOSIS — L928 Other granulomatous disorders of the skin and subcutaneous tissue: Secondary | ICD-10-CM | POA: Diagnosis not present

## 2014-11-14 LAB — CBC WITH DIFFERENTIAL/PLATELET
Basophils Absolute: 0.1 10*3/uL (ref 0.0–0.1)
Basophils Relative: 1 %
Eosinophils Absolute: 0.1 10*3/uL (ref 0.0–0.7)
Eosinophils Relative: 1 %
HEMATOCRIT: 45.6 % (ref 39.0–52.0)
Hemoglobin: 15.7 g/dL (ref 13.0–17.0)
LYMPHS PCT: 20 %
Lymphs Abs: 1.9 10*3/uL (ref 0.7–4.0)
MCH: 32.3 pg (ref 26.0–34.0)
MCHC: 34.4 g/dL (ref 30.0–36.0)
MCV: 93.8 fL (ref 78.0–100.0)
MONO ABS: 0.6 10*3/uL (ref 0.1–1.0)
MONOS PCT: 7 %
NEUTROS ABS: 7 10*3/uL (ref 1.7–7.7)
Neutrophils Relative %: 71 %
Platelets: 201 10*3/uL (ref 150–400)
RBC: 4.86 MIL/uL (ref 4.22–5.81)
RDW: 13.2 % (ref 11.5–15.5)
WBC: 9.6 10*3/uL (ref 4.0–10.5)

## 2014-11-14 LAB — BASIC METABOLIC PANEL
ANION GAP: 8 (ref 5–15)
BUN: 6 mg/dL (ref 6–20)
CO2: 25 mmol/L (ref 22–32)
Calcium: 9 mg/dL (ref 8.9–10.3)
Chloride: 105 mmol/L (ref 101–111)
Creatinine, Ser: 0.87 mg/dL (ref 0.61–1.24)
GFR calc Af Amer: >60 mL/min (ref >60)
GFR calc non Af Amer: >60 mL/min (ref >60)
GLUCOSE: 131 mg/dL — AB (ref 65–99)
POTASSIUM: 4.2 mmol/L (ref 3.5–5.1)
Sodium: 138 mmol/L (ref 135–145)

## 2014-11-14 LAB — SURGICAL PCR SCREEN
MRSA, PCR: NEGATIVE
Staphylococcus aureus: NEGATIVE

## 2014-11-19 ENCOUNTER — Encounter (HOSPITAL_COMMUNITY): Payer: Self-pay | Admitting: Certified Registered Nurse Anesthetist

## 2014-11-19 ENCOUNTER — Ambulatory Visit (HOSPITAL_COMMUNITY): Payer: Medicaid Other | Admitting: Anesthesiology

## 2014-11-19 ENCOUNTER — Ambulatory Visit (HOSPITAL_COMMUNITY)
Admission: RE | Admit: 2014-11-19 | Discharge: 2014-11-19 | Disposition: A | Discharge status: Home / Self Care | Payer: Medicaid Other | Source: Ambulatory Visit | Attending: General Surgery | Admitting: General Surgery

## 2014-11-19 ENCOUNTER — Encounter (HOSPITAL_COMMUNITY)
Admission: RE | Disposition: A | Discharge status: Home / Self Care | Payer: Self-pay | Source: Ambulatory Visit | Attending: General Surgery

## 2014-11-19 DIAGNOSIS — Z1889 Other specified retained foreign body fragments: Secondary | ICD-10-CM | POA: Insufficient documentation

## 2014-11-19 DIAGNOSIS — F17210 Nicotine dependence, cigarettes, uncomplicated: Secondary | ICD-10-CM | POA: Diagnosis not present

## 2014-11-19 DIAGNOSIS — L923 Foreign body granuloma of the skin and subcutaneous tissue: Secondary | ICD-10-CM | POA: Diagnosis not present

## 2014-11-19 HISTORY — PX: MASS EXCISION: SHX2000

## 2014-11-19 SURGERY — EXCISION MASS
Anesthesia: General | Site: Groin | Laterality: Left

## 2014-11-19 MED ORDER — ONDANSETRON HCL 4 MG/2ML IJ SOLN
INTRAMUSCULAR | Status: DC | PRN
  Administered 2014-11-19: 4 mg via INTRAVENOUS

## 2014-11-19 MED ORDER — CHLORHEXIDINE GLUCONATE 4 % EX LIQD: 1.0000 "application " | Freq: Once | CUTANEOUS | Status: DC

## 2014-11-19 MED ORDER — LIDOCAINE HCL (CARDIAC) 20 MG/ML IV SOLN
INTRAVENOUS | Status: DC | PRN
  Administered 2014-11-19: 50 mg via INTRAVENOUS

## 2014-11-19 MED ORDER — POVIDONE-IODINE 10 % OINT PACKET
TOPICAL_OINTMENT | CUTANEOUS | Status: DC | PRN
  Administered 2014-11-19: 1 via TOPICAL

## 2014-11-19 MED ORDER — FENTANYL CITRATE (PF) 100 MCG/2ML IJ SOLN
INTRAMUSCULAR | Status: DC | PRN
  Administered 2014-11-19 (×5): 50 ug via INTRAVENOUS

## 2014-11-19 MED ORDER — ONDANSETRON HCL 4 MG/2ML IJ SOLN
INTRAMUSCULAR | Status: AC
  Filled 2014-11-19: qty 2

## 2014-11-19 MED ORDER — ONDANSETRON HCL 4 MG/2ML IJ SOLN: 4.0000 mg | Freq: Once | INTRAMUSCULAR | Status: DC | PRN

## 2014-11-19 MED ORDER — BUPIVACAINE LIPOSOME 1.3 % IJ SUSP
INTRAMUSCULAR | Status: AC
  Filled 2014-11-19: qty 20

## 2014-11-19 MED ORDER — POVIDONE-IODINE 10 % EX OINT
TOPICAL_OINTMENT | CUTANEOUS | Status: AC
Start: 2014-11-19 — End: 2014-11-19
  Filled 2014-11-19: qty 1

## 2014-11-19 MED ORDER — OXYCODONE-ACETAMINOPHEN 7.5-325 MG PO TABS: 1.0000 | ORAL_TABLET | ORAL | Status: DC | PRN

## 2014-11-19 MED ORDER — GLYCOPYRROLATE 0.2 MG/ML IJ SOLN
INTRAMUSCULAR | Status: DC | PRN
  Administered 2014-11-19: 0.2 mg via INTRAVENOUS

## 2014-11-19 MED ORDER — 0.9 % SODIUM CHLORIDE (POUR BTL) OPTIME
TOPICAL | Status: DC | PRN
  Administered 2014-11-19: 1000 mL

## 2014-11-19 MED ORDER — CEFAZOLIN SODIUM-DEXTROSE 2-3 GM-% IV SOLR
2.0000 g | INTRAVENOUS | Status: AC
Start: 2014-11-19 — End: 2014-11-19
  Administered 2014-11-19: 2 g via INTRAVENOUS
  Filled 2014-11-19: qty 50

## 2014-11-19 MED ORDER — MIDAZOLAM HCL 2 MG/2ML IJ SOLN
INTRAMUSCULAR | Status: AC
  Filled 2014-11-19: qty 2

## 2014-11-19 MED ORDER — PROPOFOL 10 MG/ML IV BOLUS
INTRAVENOUS | Status: AC
  Filled 2014-11-19: qty 20

## 2014-11-19 MED ORDER — PROMETHAZINE HCL 25 MG/ML IJ SOLN
INTRAMUSCULAR | Status: AC
  Filled 2014-11-19: qty 1

## 2014-11-19 MED ORDER — LACTATED RINGERS IV SOLN
INTRAVENOUS | Status: DC
  Administered 2014-11-19 (×2): via INTRAVENOUS

## 2014-11-19 MED ORDER — PROPOFOL 10 MG/ML IV BOLUS
INTRAVENOUS | Status: DC | PRN
  Administered 2014-11-19: 150 mg via INTRAVENOUS

## 2014-11-19 MED ORDER — ETOMIDATE 2 MG/ML IV SOLN
INTRAVENOUS | Status: AC
  Filled 2014-11-19: qty 10

## 2014-11-19 MED ORDER — PROMETHAZINE HCL 25 MG/ML IJ SOLN
6.2500 mg | INTRAMUSCULAR | Status: DC | PRN
  Administered 2014-11-19: 12.5 mg via INTRAVENOUS

## 2014-11-19 MED ORDER — GLYCOPYRROLATE 0.2 MG/ML IJ SOLN
INTRAMUSCULAR | Status: AC
  Filled 2014-11-19: qty 1

## 2014-11-19 MED ORDER — FENTANYL CITRATE (PF) 250 MCG/5ML IJ SOLN
INTRAMUSCULAR | Status: AC
  Filled 2014-11-19: qty 25

## 2014-11-19 MED ORDER — MIDAZOLAM HCL 2 MG/2ML IJ SOLN
1.0000 mg | INTRAMUSCULAR | Status: DC | PRN
  Administered 2014-11-19: 2 mg via INTRAVENOUS

## 2014-11-19 MED ORDER — BUPIVACAINE HCL (PF) 0.5 % IJ SOLN
INTRAMUSCULAR | Status: AC
  Filled 2014-11-19: qty 30

## 2014-11-19 MED ORDER — KETOROLAC TROMETHAMINE 30 MG/ML IJ SOLN
30.0000 mg | Freq: Once | INTRAMUSCULAR | Status: AC
  Administered 2014-11-19: 30 mg via INTRAVENOUS

## 2014-11-19 MED ORDER — FENTANYL CITRATE (PF) 100 MCG/2ML IJ SOLN
25.0000 ug | INTRAMUSCULAR | Status: DC | PRN
  Administered 2014-11-19 (×4): 50 ug via INTRAVENOUS
  Filled 2014-11-19 (×2): qty 2

## 2014-11-19 MED ORDER — BUPIVACAINE LIPOSOME 1.3 % IJ SUSP
INTRAMUSCULAR | Status: DC | PRN
  Administered 2014-11-19: 5 mL

## 2014-11-19 MED ORDER — SODIUM CHLORIDE 0.9 % IJ SOLN
INTRAMUSCULAR | Status: AC
  Filled 2014-11-19: qty 3

## 2014-11-19 MED ORDER — KETOROLAC TROMETHAMINE 30 MG/ML IJ SOLN
INTRAMUSCULAR | Status: AC
  Filled 2014-11-19: qty 1

## 2014-11-19 SURGICAL SUPPLY — 39 items
BAG HAMPER (MISCELLANEOUS) ×3 IMPLANT
CHLORAPREP W/TINT 10.5 ML (MISCELLANEOUS) IMPLANT
CLOTH BEACON ORANGE TIMEOUT ST (SAFETY) ×3 IMPLANT
COVER LIGHT HANDLE STERIS (MISCELLANEOUS) ×6 IMPLANT
DECANTER SPIKE VIAL GLASS SM (MISCELLANEOUS) ×3 IMPLANT
ELECT NEEDLE TIP 2.8 STRL (NEEDLE) IMPLANT
ELECT REM PT RETURN 9FT ADLT (ELECTROSURGICAL) ×3
ELECTRODE REM PT RTRN 9FT ADLT (ELECTROSURGICAL) ×1 IMPLANT
FORMALIN 10 PREFIL 120ML (MISCELLANEOUS) ×3 IMPLANT
GLOVE BIOGEL PI IND STRL 7.0 (GLOVE) ×1 IMPLANT
GLOVE BIOGEL PI IND STRL 7.5 (GLOVE) ×1 IMPLANT
GLOVE BIOGEL PI INDICATOR 7.0 (GLOVE) ×2
GLOVE BIOGEL PI INDICATOR 7.5 (GLOVE) ×2
GLOVE ECLIPSE 6.5 STRL STRAW (GLOVE) ×3 IMPLANT
GLOVE SURG SS PI 7.5 STRL IVOR (GLOVE) ×3 IMPLANT
GOWN STRL REUS W/ TWL XL LVL3 (GOWN DISPOSABLE) ×1 IMPLANT
GOWN STRL REUS W/TWL LRG LVL3 (GOWN DISPOSABLE) ×3 IMPLANT
GOWN STRL REUS W/TWL XL LVL3 (GOWN DISPOSABLE) ×2
KIT ROOM TURNOVER APOR (KITS) ×3 IMPLANT
LIQUID BAND (GAUZE/BANDAGES/DRESSINGS) IMPLANT
MANIFOLD NEPTUNE II (INSTRUMENTS) ×3 IMPLANT
NEEDLE HYPO 18GX1.5 BLUNT FILL (NEEDLE) ×3 IMPLANT
NEEDLE HYPO 21X1.5 SAFETY (NEEDLE) ×3 IMPLANT
NEEDLE HYPO 25X1 1.5 SAFETY (NEEDLE) ×3 IMPLANT
NS IRRIG 1000ML POUR BTL (IV SOLUTION) ×3 IMPLANT
PACK MINOR (CUSTOM PROCEDURE TRAY) IMPLANT
PAD ARMBOARD 7.5X6 YLW CONV (MISCELLANEOUS) ×3 IMPLANT
SET BASIN LINEN APH (SET/KITS/TRAYS/PACK) ×3 IMPLANT
SPONGE GAUZE 4X4 12PLY (GAUZE/BANDAGES/DRESSINGS) ×3 IMPLANT
STAPLER VISISTAT (STAPLE) ×3 IMPLANT
SUT ETHILON 3 0 FSL (SUTURE) IMPLANT
SUT NOVA NAB GS-22 2 2-0 T-19 (SUTURE) ×3 IMPLANT
SUT PROLENE 4 0 PS 2 18 (SUTURE) IMPLANT
SUT VIC AB 3-0 SH 27 (SUTURE) ×2
SUT VIC AB 3-0 SH 27X BRD (SUTURE) ×1 IMPLANT
SUT VIC AB 4-0 PS2 27 (SUTURE) IMPLANT
SYR 20CC LL (SYRINGE) ×3 IMPLANT
SYR CONTROL 10ML LL (SYRINGE) ×3 IMPLANT
TAPE CLOTH SURG 4X10 WHT LF (GAUZE/BANDAGES/DRESSINGS) ×3 IMPLANT

## 2014-11-19 NOTE — Progress Notes (Signed)
Awakens to voice stimuli. Oriented to place per nurse. C/O postop left groin pain. Reassurance given.

## 2014-11-19 NOTE — Op Note (Signed)
Patient:  Bradley White  DOB:  Mar 07, 1975  MRN:  [id number]   Preop Diagnosis:  Granuloma with draining sinus, left groin  Postop Diagnosis:  Same  Procedure:  Debridement of left groin surgical wound  Surgeon:  Franky MachoMark Kwan Shellhammer, M.D.  Anes:  Gen.  Indications:  Patient is a 39 year old white male status post a left inguinal herniorrhaphy with mesh in 2013 who presents with a draining sinus along the midportion of the surgical wound. The risks and benefits of the procedure including bleeding, infection, and recurrence of the granuloma were fully explained to the patient, who gave informed consent.  Procedure note:  The patient was placed in the supine position. After general anesthesia was administered, the left groin region was prepped and draped using the usual sterile technique with Betadine. Surgical site confirmation was performed.  The previous surgical wound including a draining sinus was excised in elliptical fashion. The dissection was taken down to the mesh. The sinus track went down to the floor of the inguinal canal and was involving the mesh along this area. This mesh was excised and debrided sharply. Any other areas of granulomatous tissue was excised and subcutaneous tissue as well as the previously placed mesh. The hernia repair remained intact. The wound was irrigated normal saline. Curettes were used to remove any remaining granulomatous tissue. The external oblique aponeuroses was freed away in order to reapproximate it over the mesh using 2-0 Novafil interrupted sutures. The subcutaneous layer was reapproximated using 3-0 Vicryl interrupted sutures. Exparel was instilled the surrounding wound. Skin staples were then applied. Betadine ointment and dry sterile dressing were applied.  All tape and needle counts were correct at the end of the procedure. Patient was awakened and transferred to PACU in stable condition.  Complications:  None  EBL:  Minimal  Specimen:   None

## 2014-11-19 NOTE — Anesthesia Procedure Notes (Signed)
Procedure Name: LMA Insertion Date/Time: 11/19/2014 7:33 AM Performed by: Patrcia DollyMOSES, Aira Sallade Pre-anesthesia Checklist: Patient identified, Patient being monitored, Timeout performed, Emergency Drugs available and Suction available Patient Re-evaluated:Patient Re-evaluated prior to inductionOxygen Delivery Method: Circle System Utilized Preoxygenation: Pre-oxygenation with 100% oxygen Intubation Type: IV induction Ventilation: Mask ventilation without difficulty LMA Size: 3.0 Number of attempts: 1 Placement Confirmation: positive ETCO2 and breath sounds checked- equal and bilateral Dental Injury: Teeth and Oropharynx as per pre-operative assessment

## 2014-11-19 NOTE — Addendum Note (Signed)
Addendum  created 11/19/14 0954 by Courtlyn Aki A Alinah Sheard, CRNA   Modules edited: Charges VN    

## 2014-11-19 NOTE — Progress Notes (Signed)
Resting quietly. resp adequate/nonlabored.  

## 2014-11-19 NOTE — Discharge Instructions (Signed)
Keep wound clean and dry daily.  No heavy lifting.

## 2014-11-19 NOTE — Progress Notes (Signed)
Awake. Continues yelling in pain. Uncooperative. Kicking legs. Hitting siderails with fist. Fentanyl 50 mcg IV given.

## 2014-11-19 NOTE — Progress Notes (Signed)
Continues to be combative and uncooperative. Wants to go home and smoke his "pot". Puts siderail down on bed and attempts to get out of bed. Continues to kick legs and yell.

## 2014-11-19 NOTE — Progress Notes (Signed)
States "I just want to go home and smoke my pot for my pain." Advised against smoking the pot because of the medicines given to day. Voiced understanding.

## 2014-11-19 NOTE — Progress Notes (Signed)
From OR. Awake. Yelling "I am in pain.". Moaning/groaning. Oriented to place per nurse. Kicking legs. Combative. Refuses to use O2 and to have temp taken. Reassurance given.

## 2014-11-19 NOTE — Anesthesia Preprocedure Evaluation (Signed)
Anesthesia Evaluation  Patient identified by MRN, date of birth, ID band Patient awake    Reviewed: Allergy & Precautions, NPO status , Patient's Chart, lab work & pertinent test results  History of Anesthesia Complications Negative for: history of anesthetic complications  Airway Mallampati: II  TM Distance: >3 FB     Dental  (+) Poor Dentition, Missing,    Pulmonary Current Smoker,     + decreased breath sounds      Cardiovascular  Rhythm:Regular Rate:Normal     Neuro/Psych    GI/Hepatic (+)     substance abuse  marijuana use,   Endo/Other    Renal/GU      Musculoskeletal   Abdominal (+) + scaphoid  Abdomen: soft. Bowel sounds: normal.  Peds  Hematology   Anesthesia Other Findings   Reproductive/Obstetrics                             Anesthesia Physical Anesthesia Plan  ASA: III  Anesthesia Plan: General   Post-op Pain Management:    Induction: Intravenous  Airway Management Planned: LMA  Additional Equipment:   Intra-op Plan:   Post-operative Plan: Extubation in OR  Informed Consent: I have reviewed the patients History and Physical, chart, labs and discussed the procedure including the risks, benefits and alternatives for the proposed anesthesia with the patient or authorized representative who has indicated his/her understanding and acceptance.   Dental advisory given  Plan Discussed with: CRNA  Anesthesia Plan Comments:         Anesthesia Quick Evaluation

## 2014-11-19 NOTE — Transfer of Care (Signed)
Immediate Anesthesia Transfer of Care Note  Patient: Bradley White  Procedure(s) Performed: Procedure(s): EXCISION SUTURE GRANULOMA LEFT GROIN (Left)  Patient Location: PACU  Anesthesia Type:General  Level of Consciousness: sedated, patient cooperative and responds to stimulation  Airway & Oxygen Therapy: Patient Spontanous Breathing and Patient connected to face mask oxygen  Post-op Assessment: Report given to RN, Post -op Vital signs reviewed and stable and Patient moving all extremities X 4  Post vital signs: Reviewed and stable  Last Vitals:  Filed Vitals:   11/19/14 0710  BP: 103/70  Pulse:   Temp:   Resp: 18    Complications: No apparent anesthesia complications

## 2014-11-19 NOTE — Interval H&P Note (Signed)
History and Physical Interval Note:  11/19/2014 7:14 AM  Bradley BattyJoseph D Strawser  has presented today for surgery, with the diagnosis of granuloma left groin  The various methods of treatment have been discussed with the patient and family. After consideration of risks, benefits and other options for treatment, the patient has consented to  Procedure(s): EXCISION SUTURE GRANULOMA LEFT GROIN (Left) as a surgical intervention .  The patient's history has been reviewed, patient examined, no change in status, stable for surgery.  I have reviewed the patient's chart and labs.  Questions were answered to the patient's satisfaction.     Franky MachoJENKINS,Nashly Olsson A

## 2014-11-19 NOTE — Anesthesia Postprocedure Evaluation (Signed)
  Anesthesia Post-op Note  Patient: Bradley BattyJoseph D Paccione  Procedure(s) Performed: Procedure(s): EXCISION SUTURE GRANULOMA LEFT GROIN (Left)  Patient Location: PACU  Anesthesia Type:General  Level of Consciousness: sedated, patient cooperative and responds to stimulation  Airway and Oxygen Therapy: Patient Spontanous Breathing and Patient connected to face mask oxygen  Post-op Pain: none  Post-op Assessment: Post-op Vital signs reviewed, Patient's Cardiovascular Status Stable, Respiratory Function Stable, Patent Airway, No signs of Nausea or vomiting and No headache              Post-op Vital Signs: Reviewed and stable  Last Vitals:  Filed Vitals:   11/19/14 0710  BP: 103/70  Pulse:   Temp:   Resp: 18    Complications: No apparent anesthesia complications

## 2014-11-20 ENCOUNTER — Encounter (HOSPITAL_COMMUNITY): Payer: Self-pay | Admitting: General Surgery

## 2015-06-12 ENCOUNTER — Other Ambulatory Visit: Payer: Self-pay | Admitting: General Surgery

## 2015-06-12 NOTE — H&P (Signed)
History of Present Illness Bradley Levee MD; 06/12/2015 1:44 PM) Patient words: anal mass.  The patient is a 40 year old male who presents with anal lesions. 40 year old male who presents to the office with complaints of anal lesions that of them present since he was a child. He says they swell during the day and then the swelling goes down at night. He has occasional bleeding and itching as well.   Other Problems Bradley Bickers, LPN; 1/61/0960 4:54 PM) Hemorrhoids High blood pressure Inguinal Hernia Sleep Apnea  Past Surgical History Bradley Bickers, LPN; 0/98/1191 4:78 PM) Foot Surgery Right. Lung Surgery Left. Open Inguinal Hernia Surgery Left.  Allergies Bradley Bickers, LPN; 2/95/6213 0:86 PM) No Known Drug Allergies 06/12/2015  Medication History Bradley Bickers, LPN; 5/78/4696 2:95 PM) ClonazePAM (0.5MG  Tablet, Oral as needed) Active. Medications Reconciled  Social History Bradley Bickers, LPN; 2/84/1324 4:01 PM) Caffeine use Carbonated beverages. No alcohol use No drug use Tobacco use Current every day smoker.  Family History Bradley Bickers, LPN; 0/27/2536 6:44 PM) Alcohol Abuse Father. Diabetes Mellitus Family Members In General. Heart Disease Son. Hypertension Family Members In General.     Review of Systems Bradley Bickers LPN; 0/34/7425 9:56 PM) General Not Present- Appetite Loss, Chills, Fatigue, Fever, Night Sweats, Weight Gain and Weight Loss. Skin Not Present- Change in Wart/Mole, Dryness, Hives, Jaundice, New Lesions, Non-Healing Wounds, Rash and Ulcer. HEENT Not Present- Earache, Hearing Loss, Hoarseness, Nose Bleed, Oral Ulcers, Ringing in the Ears, Seasonal Allergies, Sinus Pain, Sore Throat, Visual Disturbances, Wears glasses/contact lenses and Yellow Eyes. Respiratory Present- Difficulty Breathing. Not Present- Bloody sputum, Chronic Cough, Snoring and Wheezing. Breast Not Present- Breast Mass, Breast Pain, Nipple Discharge and Skin  Changes. Cardiovascular Present- Shortness of Breath. Not Present- Chest Pain, Difficulty Breathing Lying Down, Leg Cramps, Palpitations, Rapid Heart Rate and Swelling of Extremities. Gastrointestinal Present- Hemorrhoids and Rectal Pain. Not Present- Abdominal Pain, Bloating, Bloody Stool, Change in Bowel Habits, Chronic diarrhea, Constipation, Difficulty Swallowing, Excessive gas, Gets full quickly at meals, Indigestion, Nausea and Vomiting. Male Genitourinary Not Present- Blood in Urine, Change in Urinary Stream, Frequency, Impotence, Nocturia, Painful Urination, Urgency and Urine Leakage. Musculoskeletal Present- Joint Pain. Not Present- Back Pain, Joint Stiffness, Muscle Pain, Muscle Weakness and Swelling of Extremities. Neurological Present- Headaches. Not Present- Decreased Memory, Fainting, Numbness, Seizures, Tingling, Tremor, Trouble walking and Weakness. Psychiatric Not Present- Anxiety, Bipolar, Change in Sleep Pattern, Depression, Fearful and Frequent crying. Endocrine Not Present- Cold Intolerance, Excessive Hunger, Hair Changes, Heat Intolerance, Hot flashes and New Diabetes. Hematology Not Present- Easy Bruising, Excessive bleeding, Gland problems, HIV and Persistent Infections.  Vitals Bradley Endo Dockery LPN; 3/87/5643 3:29 PM) 06/12/2015 1:40 PM Weight: 98.7 lb Height: 67in Body Surface Area: 1.5 m Body Mass Index: 15.46 kg/m  Temp.: 98.48F(Oral)  Pulse: 60 (Regular)  BP: 110/72 (Sitting, Left Arm, Standard)      Physical Exam Bradley Levee MD; 06/12/2015 1:51 PM)  General Mental Status-Alert. General Appearance-Not in acute distress. Build & Nutrition-Well nourished. Posture-Normal posture. Gait-Normal.  Head and Neck Head-normocephalic, atraumatic with no lesions or palpable masses. Trachea-midline.  Chest and Lung Exam Chest and lung exam reveals -on auscultation, normal breath sounds, no adventitious sounds and normal vocal  resonance.  Cardiovascular Cardiovascular examination reveals -normal heart sounds, regular rate and rhythm with no murmurs.  Abdomen Inspection Inspection of the abdomen reveals - No Hernias. Palpation/Percussion Palpation and Percussion of the abdomen reveal - Soft, Non Tender, No Rigidity (guarding), No hepatosplenomegaly and No Palpable abdominal masses.  Rectal  Anorectal Exam External - Note: large area of condyloma to the perianal region.  Neurologic Neurologic evaluation reveals -alert and oriented x 3 with no impairment of recent or remote memory, normal attention span and ability to concentrate, normal sensation and normal coordination.  Musculoskeletal Normal Exam - Bilateral-Upper Extremity Strength Normal and Lower Extremity Strength Normal.    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 We discussed a two-step procedure for removing the condyloma. First step would be to debulk the area and in the second step would be to laser ablate the smaller lesions. We discussed that this can be a painful surgery and he would need to take some time off work. We will plan on getting this scheduled as soon as possible. Risks and benefits were discussed in detail. Risks include bleeding, pain, infection and recurrence. We discussed the significant risk of bleeding and cancerous transformation if nothing is done.

## 2015-06-23 ENCOUNTER — Ambulatory Visit
Admission: RE | Admit: 2015-06-23 | Discharge: 2015-06-23 | Disposition: A | Discharge status: Home / Self Care | Payer: Medicaid Other | Source: Ambulatory Visit | Attending: Nurse Practitioner | Admitting: Nurse Practitioner

## 2015-06-23 ENCOUNTER — Other Ambulatory Visit: Payer: Self-pay | Admitting: Nurse Practitioner

## 2015-06-23 DIAGNOSIS — R05 Cough: Secondary | ICD-10-CM

## 2015-06-23 DIAGNOSIS — R059 Cough, unspecified: Secondary | ICD-10-CM

## 2016-02-17 DIAGNOSIS — F419 Anxiety disorder, unspecified: Secondary | ICD-10-CM | POA: Diagnosis not present

## 2016-02-17 DIAGNOSIS — F902 Attention-deficit hyperactivity disorder, combined type: Secondary | ICD-10-CM | POA: Diagnosis not present

## 2016-04-27 ENCOUNTER — Encounter (HOSPITAL_COMMUNITY): Payer: Self-pay | Admitting: Emergency Medicine

## 2016-04-27 DIAGNOSIS — S0181XA Laceration without foreign body of other part of head, initial encounter: Secondary | ICD-10-CM | POA: Diagnosis not present

## 2016-04-27 DIAGNOSIS — F909 Attention-deficit hyperactivity disorder, unspecified type: Secondary | ICD-10-CM | POA: Insufficient documentation

## 2016-04-27 DIAGNOSIS — F17210 Nicotine dependence, cigarettes, uncomplicated: Secondary | ICD-10-CM | POA: Insufficient documentation

## 2016-04-27 DIAGNOSIS — Y92410 Unspecified street and highway as the place of occurrence of the external cause: Secondary | ICD-10-CM | POA: Diagnosis not present

## 2016-04-27 DIAGNOSIS — S20312A Abrasion of left front wall of thorax, initial encounter: Secondary | ICD-10-CM | POA: Insufficient documentation

## 2016-04-27 DIAGNOSIS — S81001A Unspecified open wound, right knee, initial encounter: Secondary | ICD-10-CM | POA: Insufficient documentation

## 2016-04-27 DIAGNOSIS — Y939 Activity, unspecified: Secondary | ICD-10-CM | POA: Insufficient documentation

## 2016-04-27 DIAGNOSIS — Y999 Unspecified external cause status: Secondary | ICD-10-CM | POA: Insufficient documentation

## 2016-04-27 DIAGNOSIS — S0990XA Unspecified injury of head, initial encounter: Secondary | ICD-10-CM | POA: Diagnosis present

## 2016-04-27 DIAGNOSIS — S61213A Laceration without foreign body of left middle finger without damage to nail, initial encounter: Secondary | ICD-10-CM | POA: Insufficient documentation

## 2016-04-27 DIAGNOSIS — S61211A Laceration without foreign body of left index finger without damage to nail, initial encounter: Secondary | ICD-10-CM | POA: Diagnosis not present
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
## 2016-04-28 ENCOUNTER — Encounter (HOSPITAL_COMMUNITY): Payer: Self-pay | Admitting: Radiology

## 2016-04-28 ENCOUNTER — Emergency Department (HOSPITAL_COMMUNITY)
Admission: EM | Admit: 2016-04-28 | Discharge: 2016-04-28 | Discharge status: Against Medical Advice | Payer: No Typology Code available for payment source | Attending: Emergency Medicine | Admitting: Emergency Medicine

## 2016-04-28 ENCOUNTER — Emergency Department (HOSPITAL_COMMUNITY): Payer: No Typology Code available for payment source

## 2016-04-28 DIAGNOSIS — S0181XA Laceration without foreign body of other part of head, initial encounter: Secondary | ICD-10-CM | POA: Diagnosis not present

## 2016-04-28 HISTORY — DX: Attention-deficit hyperactivity disorder, unspecified type: F90.9

## 2016-04-28 LAB — I-STAT CHEM 8, ED
BUN: 11 mg/dL (ref 6–20)
Calcium, Ion: 1.11 mmol/L — ABNORMAL LOW (ref 1.15–1.40)
Chloride: 106 mmol/L (ref 101–111)
Creatinine, Ser: 0.9 mg/dL (ref 0.61–1.24)
Glucose, Bld: 96 mg/dL (ref 65–99)
HEMATOCRIT: 45 % (ref 39.0–52.0)
HEMOGLOBIN: 15.3 g/dL (ref 13.0–17.0)
Potassium: 3.8 mmol/L (ref 3.5–5.1)
SODIUM: 139 mmol/L (ref 135–145)
TCO2: 25 mmol/L (ref 0–100)

## 2016-04-28 MED ORDER — HYDROCODONE-ACETAMINOPHEN 5-325 MG PO TABS
2.0000 | ORAL_TABLET | Freq: Once | ORAL | Status: AC
  Administered 2016-04-28: 2 via ORAL
  Filled 2016-04-28: qty 2

## 2016-04-28 MED ORDER — LIDOCAINE-EPINEPHRINE (PF) 2 %-1:200000 IJ SOLN
20.0000 mL | Freq: Once | INTRAMUSCULAR | Status: AC
  Administered 2016-04-28: 20 mL
  Filled 2016-04-28: qty 20

## 2016-04-28 MED ORDER — IOPAMIDOL (ISOVUE-300) INJECTION 61%: 100.0000 mL | Freq: Once | INTRAVENOUS | Status: DC | PRN

## 2016-04-28 NOTE — ED Notes (Signed)
Pt continuously getting verbally aggressive against staff and threatening to leave AMA, EDP and this RN oriented pt about the importance of this test to r/o any mayor injury, pt still don't understand and is fusing about the wait.

## 2016-04-28 NOTE — ED Notes (Signed)
Pt going back to peds 8 to be with his daughter.  Updated on wait for treatment room.

## 2016-04-28 NOTE — ED Notes (Signed)
Pt walks out after stating he is leaving . IV remioved. Pt urged to return or seek treatment foe any new or worse symptoms.

## 2016-04-28 NOTE — ED Provider Notes (Signed)
LACERATION REPAIR Performed by: Antony Madura Authorized by: Antony Madura Consent: Verbal consent obtained. Risks and benefits: risks, benefits and alternatives were discussed Consent given by: patient Patient identity confirmed: provided demographic data Prepped and Draped in normal sterile fashion Wound explored  Laceration Location: forehead  Laceration Length: 4cm  No Foreign Bodies seen or palpated  Anesthesia: local infiltration  Local anesthetic: lidocaine 2% with epinephrine  Anesthetic total: 4 ml  Irrigation method: syringe Amount of cleaning: standard  Skin closure: 4-0 prolene  Number of sutures: 4  Technique: simple interrupted  Patient tolerance: Patient tolerated the procedure well with no immediate complications.     Antony Madura, PA-C 04/28/16 1610    Glynn Octave, MD 04/28/16 [phone]

## 2016-04-28 NOTE — ED Notes (Signed)
Pt remove all the monitor cables unable to get VS at this moment, pt just waiting for CT.

## 2016-04-28 NOTE — ED Notes (Signed)
Pt back in main ED waiting room.  Updated on wait for treatment room.

## 2016-04-28 NOTE — ED Notes (Signed)
Pt refused blood draw,  Notified nurse and edp.

## 2016-04-28 NOTE — ED Notes (Signed)
Patient transported to X-ray 
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
## 2016-06-09 ENCOUNTER — Emergency Department (HOSPITAL_COMMUNITY): Payer: Medicaid Other

## 2016-06-09 ENCOUNTER — Encounter (HOSPITAL_COMMUNITY): Payer: Self-pay | Admitting: Nurse Practitioner

## 2016-06-09 ENCOUNTER — Emergency Department (HOSPITAL_COMMUNITY)
Admission: EM | Admit: 2016-06-09 | Discharge: 2016-06-09 | Disposition: A | Discharge status: Home / Self Care | Payer: Medicaid Other | Attending: Emergency Medicine | Admitting: Emergency Medicine

## 2016-06-09 DIAGNOSIS — S0990XD Unspecified injury of head, subsequent encounter: Secondary | ICD-10-CM | POA: Diagnosis present

## 2016-06-09 DIAGNOSIS — F17200 Nicotine dependence, unspecified, uncomplicated: Secondary | ICD-10-CM | POA: Diagnosis not present

## 2016-06-09 DIAGNOSIS — F0781 Postconcussional syndrome: Secondary | ICD-10-CM | POA: Insufficient documentation

## 2016-06-09 DIAGNOSIS — F909 Attention-deficit hyperactivity disorder, unspecified type: Secondary | ICD-10-CM | POA: Insufficient documentation

## 2016-06-09 DIAGNOSIS — Z79899 Other long term (current) drug therapy: Secondary | ICD-10-CM | POA: Insufficient documentation

## 2016-06-09 DIAGNOSIS — G44319 Acute post-traumatic headache, not intractable: Secondary | ICD-10-CM | POA: Diagnosis not present

## 2016-06-09 LAB — CBC
HCT: 47.7 % (ref 39.0–52.0)
Hemoglobin: 16.6 g/dL (ref 13.0–17.0)
MCH: 32.3 pg (ref 26.0–34.0)
MCHC: 34.8 g/dL (ref 30.0–36.0)
MCV: 92.8 fL (ref 78.0–100.0)
Platelets: 208 10*3/uL (ref 150–400)
RBC: 5.14 MIL/uL (ref 4.22–5.81)
RDW: 12.9 % (ref 11.5–15.5)
WBC: 10.4 10*3/uL (ref 4.0–10.5)

## 2016-06-09 LAB — COMPREHENSIVE METABOLIC PANEL
ALK PHOS: 69 U/L (ref 38–126)
ALT: 13 U/L — AB (ref 17–63)
ANION GAP: 8 (ref 5–15)
AST: 26 U/L (ref 15–41)
Albumin: 4.3 g/dL (ref 3.5–5.0)
BILIRUBIN TOTAL: 0.5 mg/dL (ref 0.3–1.2)
BUN: 8 mg/dL (ref 6–20)
CALCIUM: 9.3 mg/dL (ref 8.9–10.3)
CO2: 23 mmol/L (ref 22–32)
CREATININE: 0.95 mg/dL (ref 0.61–1.24)
Chloride: 106 mmol/L (ref 101–111)
GLUCOSE: 143 mg/dL — AB (ref 65–99)
Potassium: 3.9 mmol/L (ref 3.5–5.1)
SODIUM: 137 mmol/L (ref 135–145)
TOTAL PROTEIN: 6.5 g/dL (ref 6.5–8.1)

## 2016-06-09 MED ORDER — NAPROXEN 250 MG PO TABS
500.0000 mg | ORAL_TABLET | Freq: Once | ORAL | Status: AC
  Administered 2016-06-09: 500 mg via ORAL
  Filled 2016-06-09: qty 2

## 2016-06-09 MED ORDER — METOCLOPRAMIDE HCL 10 MG PO TABS: 10.0000 mg | ORAL_TABLET | Freq: Four times a day (QID) | ORAL | 0 refills | Status: DC | PRN

## 2016-06-09 MED ORDER — AMITRIPTYLINE HCL 25 MG PO TABS: 25.0000 mg | ORAL_TABLET | Freq: Every day | ORAL | 0 refills | Status: DC

## 2016-06-09 MED ORDER — AMITRIPTYLINE HCL 25 MG PO TABS
25.0000 mg | ORAL_TABLET | Freq: Every day | ORAL | Status: DC
  Administered 2016-06-09: 25 mg via ORAL
  Filled 2016-06-09: qty 1

## 2016-06-09 MED ORDER — BUTALBITAL-APAP-CAFFEINE 50-325-40 MG PO TABS: 2.0000 | ORAL_TABLET | Freq: Once | ORAL | Status: DC
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
## 2016-06-09 NOTE — Discharge Instructions (Signed)
You have declined recommended admission today. We believe your symptoms to be due to post concussive syndrome. Take amitriptyline and Reglan as prescribed for symptoms. [facility]. Do not drive a motor vehicle or operate heavy machinery until cleared by a neurologist. Return for new or concerning symptoms.

## 2016-06-09 NOTE — ED Notes (Signed)
Pt stable, ambulatory, states understanding of discharge instructions 
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
## 2016-06-09 NOTE — ED Notes (Signed)
Patient transported to CT 

## 2016-06-09 NOTE — ED Notes (Signed)
Called CT to see where pt falls in line, CT unable to give time frame

## 2016-07-22 ENCOUNTER — Encounter: Payer: Self-pay | Admitting: Neurology

## 2016-08-18 ENCOUNTER — Ambulatory Visit: Payer: Medicaid Other | Admitting: Neurology

## 2016-08-26 ENCOUNTER — Ambulatory Visit: Payer: Medicaid Other | Admitting: Neurology

## 2016-11-03 ENCOUNTER — Ambulatory Visit: Payer: Medicaid Other | Admitting: Neurology

## 2017-08-28 DIAGNOSIS — M542 Cervicalgia: Secondary | ICD-10-CM | POA: Diagnosis not present

## 2017-08-28 DIAGNOSIS — R252 Cramp and spasm: Secondary | ICD-10-CM | POA: Diagnosis not present
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
## 2017-12-11 DIAGNOSIS — M542 Cervicalgia: Secondary | ICD-10-CM | POA: Diagnosis not present

## 2017-12-11 DIAGNOSIS — F909 Attention-deficit hyperactivity disorder, unspecified type: Secondary | ICD-10-CM | POA: Diagnosis not present

## 2017-12-11 DIAGNOSIS — R252 Cramp and spasm: Secondary | ICD-10-CM | POA: Diagnosis not present

## 2018-01-04 ENCOUNTER — Ambulatory Visit: Payer: Medicaid Other | Attending: Adult Health | Admitting: Physical Therapy

## 2018-01-04 ENCOUNTER — Telehealth: Payer: Self-pay | Admitting: Physical Therapy

## 2018-01-04 ENCOUNTER — Other Ambulatory Visit: Payer: Self-pay

## 2018-01-04 DIAGNOSIS — M62838 Other muscle spasm: Secondary | ICD-10-CM | POA: Insufficient documentation

## 2018-01-04 DIAGNOSIS — M542 Cervicalgia: Secondary | ICD-10-CM | POA: Diagnosis not present

## 2018-01-04 DIAGNOSIS — R293 Abnormal posture: Secondary | ICD-10-CM | POA: Diagnosis not present

## 2018-01-04 DIAGNOSIS — M25512 Pain in left shoulder: Secondary | ICD-10-CM | POA: Diagnosis not present
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
## 2018-01-23 ENCOUNTER — Ambulatory Visit: Payer: Medicaid Other | Attending: Adult Health

## 2018-01-23 DIAGNOSIS — R293 Abnormal posture: Secondary | ICD-10-CM | POA: Diagnosis not present

## 2018-01-23 DIAGNOSIS — M25512 Pain in left shoulder: Secondary | ICD-10-CM | POA: Diagnosis not present

## 2018-01-23 DIAGNOSIS — M62838 Other muscle spasm: Secondary | ICD-10-CM | POA: Diagnosis not present

## 2018-01-23 DIAGNOSIS — M542 Cervicalgia: Secondary | ICD-10-CM | POA: Insufficient documentation

## 2018-01-23 NOTE — Therapy (Signed)
[facility] [address], [address] [place], [place], [address] Phone: [phone]   Fax:  [phone]  Physical Therapy Treatment  Patient Details  Name: Bradley White MRN: [id number] Date of Birth: 08-Jul-1975 Referring Provider (PT): Marletta LorJulie Barr, NP   Encounter Date: 01/23/2018  PT End of Session - 01/23/18 1002    Visit Number  2    Authorization Type  Medicaid- 3 visit 01/17/18-02/06/18    Authorization - Visit Number  2    Authorization - Number of Visits  4    PT Start Time  0932    PT Stop Time  1015    PT Time Calculation (min)  43 min    Activity Tolerance  Patient limited by pain    Behavior During Therapy  [facility] for tasks assessed/performed;Flat affect       Past Medical History:  Diagnosis Date  . ADHD   . Hernia   . S/P lobectomy of lung     Past Surgical History:  Procedure Laterality Date  . ANKLE SURGERY     right  . GROIN DISSECTION  10/31/2011   Procedure: Drucie IpGROIN EXPLORATION;  Surgeon: Fabio BeringBrent C Ziegler, MD;  Location: [facility];  Service: General;  Laterality: Left;  . INGUINAL HERNIA REPAIR  04/27/2011   Procedure: HERNIA REPAIR INGUINAL ADULT;  Surgeon: Fabio BeringBrent C Ziegler, MD;  Location: [facility];  Service: General;  Laterality: Left;  Left Inguinal Hernia Repair with Mesh Plug  . lung removed     left lung d/t  cysts per patient  . MASS EXCISION Left 11/19/2014   Procedure: EXCISION SUTURE GRANULOMA LEFT GROIN;  Surgeon: Franky MachoMark Jenkins, MD;  Location: [facility];  Service: General;  Laterality: Left;  . STERIOD INJECTION  08/05/2011   Procedure: MINOR STEROID INJECTION;  Surgeon: Fabio BeringBrent C Ziegler, MD;  Location: [facility];  Service: General;  Laterality: N/A;  In Minor Room  . STERIOD INJECTION  09/02/2011   Procedure: MINOR STEROID INJECTION;  Surgeon: Fabio BeringBrent C Ziegler, MD;  Location: [facility];  Service: General;  Laterality: N/A;    There were no vitals filed for this visit.  Subjective Assessment - 01/23/18 0931     Subjective  I am sore.      Pain Score  6     Pain Location  Shoulder    Pain Orientation  Left    Pain Descriptors / Indicators  Aching;Throbbing    Pain Type  Chronic pain    Pain Onset  More than a month ago    Pain Frequency  Constant    Aggravating Factors   raising arm, sleeping on Lt side    Pain Relieving Factors  nothing helps                       [facility]/Ex[facility] - 01/23/18 0001      Exercises   Exercises  Neck;Shoulder      Neck Exercises: Supine   Cervical Rotation  Right;10 reps      Shoulder Exercises: Supine   Flexion  AAROM;Right;Left;10 reps   ~20 degrees flexion   Other Supine Exercises  decompression x 5 minutes with education regarding relaxation and breathing      Shoulder Exercises: Seated   Other Seated Exercises  unable to do shoulder shrugs and circles due to pain      Modalities   Modalities  Electrical Stimulation;Moist Heat      Moist Heat Therapy  Number Minutes Moist Heat  15 Minutes    Moist Heat Location  Cervical;Shoulder      Electrical Stimulation   Electrical Stimulation Location  Lt shoulder and neck    Electrical Stimulation Action  IFC    Electrical Stimulation Parameters  15 minutes    Electrical Stimulation Goals  Pain             PT Education - 01/23/18 0952    Education Details  Access Code: [email or web address]    Person(s) Educated  Patient;Spouse    Methods  Explanation    Comprehension  Verbalized understanding;Returned demonstration       PT Short Term Goals - 01/04/18 0953      PT SHORT TERM GOAL #1   Title  Pt will demo consisency and independence with his initial HEP to increase shoulder and cervical ROM.     Time  4    Period  Weeks    Status  New    Target Date  02/04/18        PT Long Term Goals - 01/04/18 1007      PT LONG TERM GOAL #1   Title  Pt will demo improved LUE passive ROM to atleast 160 deg which will assist with his ability to reach over head.    Time  8     Period  Weeks    Status  New    Target Date  03/09/18      PT LONG TERM GOAL #2   Title  Pt will demo improved active cervical ROM to atleast 50 deg bilaterally which will improve his ability to check blindspots while driving.     Time  8    Period  Weeks    Status  New      PT LONG TERM GOAL #3   Title  Pt will report atleast 40% improvement in his pain from the start of PT which will allow him to complete daily activity with less limitation.     Time  8    Period  Weeks    Status  New      PT LONG TERM GOAL #4   Title  Pt will have atleast 120 deg of active shoulder flexion on the Lt which will allow him to reach towards his top shelf at home.     Time  8    Period  Weeks    Status  New      PT LONG TERM GOAL #5   Title  Pt will report being able to sleep in his bed with the necessary positioning/adjustments to improve his quality of life.     Time  8    Period  Weeks    Status  New      Additional Long Term Goals   Additional Long Term Goals  Yes      PT LONG TERM GOAL #6   Title  Pt will have atleast 10# increase in his Lt grip strength, to improve his ability to complete self care activity with less difficulty.     Time  8    Period  Weeks    Status  New            Plan - 01/23/18 0946    Clinical Impression Statement  PT established HEP for pt today including gentle flexibility as tolerated and educated regarding posture.  Pt demonstrates poor seated posture and is only able sit with upright posture for 10-20 seconds.  Pt remains guarded with all movement and requires frequent tactile and verbal cues with movement.  Pt will benefit from skilled PT to encourage gentle mobility as tolerated and pain management.      Rehab Potential  Good    PT Frequency  2x / week    PT Duration  8 weeks    PT Treatment/Interventions  ADLs/Self Care Home Management;Electrical Stimulation;Cryotherapy;Moist Heat;Therapeutic activities;Therapeutic exercise;Patient/family  education;Neuromuscular re-education;Passive range of motion;Dry needling;Splinting    PT Next Visit Plan  TENS for pain management if this was helpful; passive shoulder ROM (table slides, PT assist, etc.); scap retraction/rolls, decompression     Consulted and Agree with Plan of Care  Patient       Patient will benefit from skilled therapeutic intervention in order to improve the following deficits and impairments:  Decreased activity tolerance, Decreased strength, Impaired flexibility, Impaired UE functional use, Postural dysfunction, Pain, Improper body mechanics, Decreased range of motion, Increased muscle spasms, Hypomobility  Visit Diagnosis: Cervicalgia  Left shoulder pain, unspecified chronicity  Abnormal posture  Other muscle spasm     Problem List There are no active problems to display for this patient.   Lorrene ReidKelly Cristiana Yochim, PT 01/23/18 10:03 AM  [facility] [address], [address] [place], [place], [address] Phone: [phone]   Fax:  [phone]  Name: Bradley White MRN: [id number] Date of Birth: 10/10/1975

## 2018-01-23 NOTE — Patient Instructions (Signed)
Access Code: [email or web address]  URL: [email or web address]  Date: 01/23/2018  Prepared by: Lorrene Reid   Exercises Seated Correct Posture - 10 reps - 3 sets - 1x daily - 7x weekly Supine Cervical Rotation AROM on Pillow - 10 reps - 1 sets - 3 hold - 2x daily - 7x weekly Supine Shoulder Flexion AAROM - 10 reps - 1 sets - 2x daily - 7x weekly

## 2018-01-30 ENCOUNTER — Encounter: Payer: Self-pay | Admitting: Physical Therapy

## 2018-01-30 ENCOUNTER — Ambulatory Visit: Payer: Medicaid Other | Admitting: Physical Therapy

## 2018-01-30 DIAGNOSIS — M542 Cervicalgia: Secondary | ICD-10-CM

## 2018-01-30 DIAGNOSIS — M62838 Other muscle spasm: Secondary | ICD-10-CM

## 2018-01-30 DIAGNOSIS — M25512 Pain in left shoulder: Secondary | ICD-10-CM | POA: Diagnosis not present

## 2018-01-30 DIAGNOSIS — R293 Abnormal posture: Secondary | ICD-10-CM

## 2018-01-30 NOTE — Therapy (Addendum)
[facility] [address], [place], [place], [address] Phone: [phone]   Fax:  [phone]  Physical Therapy Treatment/Discharge  Patient Details  Name: Bradley White MRN: [id number] Date of Birth: 06-09-1975 Referring Provider (PT): Alvester Chou, NP   Encounter Date: 01/30/2018  PT End of Session - 01/30/18 1049    Visit Number  3    Authorization Type  Medicaid- 3 visit 01/17/18-02/06/18    Authorization - Visit Number  3    Authorization - Number of Visits  4    PT Start Time  0935    PT Stop Time  1015    PT Time Calculation (min)  40 min    Activity Tolerance  Patient limited by pain    Behavior During Therapy  Flat affect       Past Medical History:  Diagnosis Date  . ADHD   . Hernia   . S/P lobectomy of lung     Past Surgical History:  Procedure Laterality Date  . ANKLE SURGERY     right  . GROIN DISSECTION  10/31/2011   Procedure: Virl Son EXPLORATION;  Surgeon: Donato Heinz, MD;  Location: [facility];  Service: General;  Laterality: Left;  . INGUINAL HERNIA REPAIR  04/27/2011   Procedure: HERNIA REPAIR INGUINAL ADULT;  Surgeon: Donato Heinz, MD;  Location: [facility];  Service: General;  Laterality: Left;  Left Inguinal Hernia Repair with Mesh Plug  . lung removed     left lung d/t  cysts per patient  . MASS EXCISION Left 11/19/2014   Procedure: EXCISION SUTURE GRANULOMA LEFT GROIN;  Surgeon: Aviva Signs, MD;  Location: [facility];  Service: General;  Laterality: Left;  . STERIOD INJECTION  08/05/2011   Procedure: MINOR STEROID INJECTION;  Surgeon: Donato Heinz, MD;  Location: [facility];  Service: General;  Laterality: N/A;  In Minor Room  . STERIOD INJECTION  09/02/2011   Procedure: MINOR STEROID INJECTION;  Surgeon: Donato Heinz, MD;  Location: [facility];  Service: General;  Laterality: N/A;    There were no vitals filed for this visit.  Subjective Assessment - 01/30/18 0936    Subjective  Pt states that  things are going well. He states that he was terminated by his PCP and was sent to somewhere on [address]. He has some discomfort currently.     Currently in Pain?  Yes    Pain Score  4     Pain Location  Shoulder    Pain Orientation  Left    Pain Descriptors / Indicators  Aching;Throbbing    Pain Type  Chronic pain    Pain Radiating Towards  when lifting the arm, will have pain over the top of the shoulder    Pain Onset  More than a month ago    Pain Frequency  Constant    Aggravating Factors   raising the arm, sleeping on Lt side    Pain Relieving Factors  nothing seems to help    Effect of Pain on Daily Activities  unable to lift things                        [facility] Adult PT Treatment/Exercise - 01/30/18 0001      Shoulder Exercises: Supine   Other Supine Exercises  scap retraction x8 reps, x8 reps with TENS applied during exercise, pt heavily guarded      Shoulder Exercises: Seated  Other Seated Exercises  attempted posterior shoulder rolls x5 reps, pt unable to complete through any significant ROM    Other Seated Exercises  seated posture correction x8 reps, heavy encouragement and cues required       Shoulder Exercises: Standing   Other Standing Exercises  Lt shoulder pendulums x6 reps with elbows bent   Lt shoulder TENS applied     Shoulder Exercises: Pulleys   Flexion  2 minutes      Electrical Stimulation   Electrical Stimulation Location  Lt scapular region    Electrical Stimulation Action  IFC    Electrical Stimulation Parameters  --   throughout therex portion of session to improve participatio   Electrical Stimulation Goals  Pain      Manual Therapy   Manual therapy comments  STM Lt posterior shoulder and thoracic paraspinals              PT Education - 01/30/18 1048    Education Details  technique with therex; importance of completing HEP more consistently to encourage ROM and activity participation throughout the day     Person(s)  Educated  Patient    Methods  Explanation;Handout;Verbal cues;Demonstration    Comprehension  Verbalized understanding;Need further instruction       PT Short Term Goals - 01/30/18 1055      PT SHORT TERM GOAL #1   Title  Pt will demo consisency and independence with his initial HEP to increase shoulder and cervical ROM.     Time  4    Period  Weeks    Status  On-going        PT Long Term Goals - 01/04/18 1007      PT LONG TERM GOAL #1   Title  Pt will demo improved LUE passive ROM to atleast 160 deg which will assist with his ability to reach over head.    Time  8    Period  Weeks    Status  New    Target Date  03/09/18      PT LONG TERM GOAL #2   Title  Pt will demo improved active cervical ROM to atleast 50 deg bilaterally which will improve his ability to check blindspots while driving.     Time  8    Period  Weeks    Status  New      PT LONG TERM GOAL #3   Title  Pt will report atleast 40% improvement in his pain from the start of PT which will allow him to complete daily activity with less limitation.     Time  8    Period  Weeks    Status  New      PT LONG TERM GOAL #4   Title  Pt will have atleast 120 deg of active shoulder flexion on the Lt which will allow him to reach towards his top shelf at home.     Time  8    Period  Weeks    Status  New      PT LONG TERM GOAL #5   Title  Pt will report being able to sleep in his bed with the necessary positioning/adjustments to improve his quality of life.     Time  8    Period  Weeks    Status  New      Additional Long Term Goals   Additional Long Term Goals  Yes      PT LONG TERM GOAL #6  Title  Pt will have atleast 10# increase in his Lt grip strength, to improve his ability to complete self care activity with less difficulty.     Time  8    Period  Weeks    Status  New            Plan - 01/30/18 1051    Clinical Impression Statement  Pt has been inconsistent with his HEP since last session due to  high levels of reported pain. Pt remains heavily guarded throughout his session, making it difficult to complete therex. Attempted to decrease pain with exercise by applying TENS to the Lt scapular region, however this did not appear to decrease guarding. Pt was educated on the importance of consistent HEP adherence moving forward and his HEP was adjusted to encourage small amounts of movement more frequent throughout the day. Pt appeared to have understanding of this.    Rehab Potential  Good    PT Frequency  2x / week    PT Duration  8 weeks    PT Treatment/Interventions  ADLs/Self Care Home Management;Electrical Stimulation;Cryotherapy;Moist Heat;Therapeutic activities;Therapeutic exercise;Patient/family education;Neuromuscular re-education;Passive range of motion;Dry needling;Splinting    PT Next Visit Plan  f/u on MD appointment; try UE ranger; f/u on HEP adherence and complete re-evaluation if needed; scap retraction/rolls, decompression     Consulted and Agree with Plan of Care  Patient       Patient will benefit from skilled therapeutic intervention in order to improve the following deficits and impairments:  Decreased activity tolerance, Decreased strength, Impaired flexibility, Impaired UE functional use, Postural dysfunction, Pain, Improper body mechanics, Decreased range of motion, Increased muscle spasms, Hypomobility  Visit Diagnosis: Cervicalgia  Left shoulder pain, unspecified chronicity  Abnormal posture  Other muscle spasm     Problem List There are no active problems to display for this patient.   10:57 AM,01/30/18 [place], DPT [place] [facility] [address], [place], [place], [address] Phone: [phone]   Fax:  [phone]  Name: ALBAN MARUCCI MRN: [id number] Date of Birth: 13-Sep-1975  *addendum to resolve episode of care and  d/c pt from [place]  Visits from Start of Care: 3  Current functional level related to goals / functional outcomes: See above for more details    Remaining deficits: See above for more details    Education / Equipment: See above for more details   Plan: Patient agrees to discharge.  Patient goals were not met. Patient is being discharged due to not returning since the last visit.  ?????    8:27 AM,03/08/18 Sherol Dade PT, [place] [facility]

## 2018-01-30 NOTE — Patient Instructions (Signed)
Access Code: [email or web address]  URL: [email or web address]  Date: 01/30/2018  Prepared by: Donita BrooksSara Vasilis Luhman   Exercises  Seated Correct Posture - 10 reps - 3 sets - 1x daily - 7x weekly  Supine Cervical Rotation AROM on Pillow - 10 reps - 1 sets - 3 hold - 2x daily - 7x weekly  Standing Horizontal Shoulder Pendulum Supported with Arm Bent - 5 reps - 3x daily - 7x weekly  Seated Scapular Retraction - 5 reps - 3x daily - 7x weekly    [facility] [address], [address] [place], [place] [address] Phone # [phone] Fax [phone]

## 2018-02-06 ENCOUNTER — Ambulatory Visit: Payer: Medicaid Other | Admitting: Physical Therapy

## 2018-02-06 ENCOUNTER — Telehealth: Payer: Self-pay | Admitting: Physical Therapy

## 2018-02-06 DIAGNOSIS — F900 Attention-deficit hyperactivity disorder, predominantly inattentive type: Secondary | ICD-10-CM | POA: Diagnosis not present

## 2018-02-06 DIAGNOSIS — M79602 Pain in left arm: Secondary | ICD-10-CM | POA: Diagnosis not present

## 2018-02-06 DIAGNOSIS — F39 Unspecified mood [affective] disorder: Secondary | ICD-10-CM | POA: Diagnosis not present

## 2018-02-06 NOTE — Telephone Encounter (Signed)
Called and spoke with pt regarding missed appointment. Pt states he is still at the his MD appointment.   10:32 AM,02/06/18 Donita Brooks PT, DPT [facility]  [phone]

## 2018-03-30 ENCOUNTER — Ambulatory Visit: Payer: Medicaid Other | Admitting: Internal Medicine

## 2018-06-08 ENCOUNTER — Other Ambulatory Visit: Payer: Self-pay

## 2018-06-08 ENCOUNTER — Ambulatory Visit: Payer: Medicaid Other | Attending: Internal Medicine | Admitting: Internal Medicine

## 2018-06-08 ENCOUNTER — Encounter: Payer: Self-pay | Admitting: Internal Medicine

## 2018-06-08 VITALS — BP 133/75 | HR 67 | Temp 98.2°F | Resp 18 | Ht 67.0 in | Wt 132.0 lb

## 2018-06-08 DIAGNOSIS — Z833 Family history of diabetes mellitus: Secondary | ICD-10-CM | POA: Diagnosis not present

## 2018-06-08 DIAGNOSIS — Z91012 Allergy to eggs: Secondary | ICD-10-CM | POA: Insufficient documentation

## 2018-06-08 DIAGNOSIS — F1910 Other psychoactive substance abuse, uncomplicated: Secondary | ICD-10-CM

## 2018-06-08 DIAGNOSIS — Z79899 Other long term (current) drug therapy: Secondary | ICD-10-CM | POA: Diagnosis not present

## 2018-06-08 DIAGNOSIS — F17200 Nicotine dependence, unspecified, uncomplicated: Secondary | ICD-10-CM

## 2018-06-08 DIAGNOSIS — F1990 Other psychoactive substance use, unspecified, uncomplicated: Secondary | ICD-10-CM | POA: Diagnosis not present

## 2018-06-08 DIAGNOSIS — Z8639 Personal history of other endocrine, nutritional and metabolic disease: Secondary | ICD-10-CM

## 2018-06-08 DIAGNOSIS — F17210 Nicotine dependence, cigarettes, uncomplicated: Secondary | ICD-10-CM | POA: Insufficient documentation

## 2018-06-08 DIAGNOSIS — Z8659 Personal history of other mental and behavioral disorders: Secondary | ICD-10-CM

## 2018-06-08 DIAGNOSIS — F411 Generalized anxiety disorder: Secondary | ICD-10-CM | POA: Diagnosis present

## 2018-06-08 DIAGNOSIS — F909 Attention-deficit hyperactivity disorder, unspecified type: Secondary | ICD-10-CM | POA: Diagnosis not present

## 2018-06-08 DIAGNOSIS — Z8249 Family history of ischemic heart disease and other diseases of the circulatory system: Secondary | ICD-10-CM | POA: Diagnosis not present

## 2018-06-08 MED ORDER — BUSPIRONE HCL 5 MG PO TABS: 5.0000 mg | ORAL_TABLET | Freq: Two times a day (BID) | ORAL | 1 refills | Status: DC

## 2018-06-08 NOTE — Progress Notes (Addendum)
Patient ID: White White, male    DOB: 24-Jul-1975  MRN: [id number]  CC: New Patient (Initial Visit)   Subjective: White White is a 43 y.o. male who presents for new pt visit His concerns today include:   Previous PCP was Dr. Teola White.  Last seen 2-3 mths ago.  Pt dischg from practice because he got into argument with her office staff.   Gives hx of anxiety,  ADHD and ? hyperhyroid  Pt was on Clonazepam 0.5 mg TID but reports he was taking 2 TID instead.   Out x 2 mths.  Wanted Xanax which he states worked better for him but his PCP refused to prescribe Xanax for him.  Since being discharged from her practice, he has been purchasing Xanax and Adderall on the street just to "compensate for stress and for life." Dx with anxiety 4 yrs ago.  Endorses anxiety attacks about 2 x a mth.  Last one was a few wks ago.  "I had to send my girlfriend to get me some Xanax."  In regards to his anxiety attack, patient stated "I don't get them too often but when I get them, takes a while to calm down."     -has 6 kids ages 2 through 72 and GF has 3 kids; all of their combined 9 children, 6 of them live with him and his girlfriend.  This adds to his stress.  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  Was on Adderall.  Out x 2 mths.  Has been purchasing off the street.  Last had one about 1 wk ago.  Endorses problems with concentration and completing tasks.  He feels Adderall keeps him focused.  When asked why he is on methimazole, patient stated that he was placed on it to help keep his weight down.  When asked about whether he has over functioning thyroid, he said that he did.  He was on methimazole 5 mg but out of it x 2 mth  Denies palpitations, no wgh changes, diarrhea, not feeling hot or cold all the time  Past medical history, social history, surgical history, family history reviewed. Current Outpatient Medications on File Prior to Visit  Medication Sig Dispense Refill   . amphetamine-dextroamphetamine (ADDERALL) 30 MG tablet Take 30 mg by mouth daily.    . clonazePAM (KLONOPIN) 1 MG tablet Take 1 mg by mouth 4 (four) times daily as needed for anxiety.     . methimazole (TAPAZOLE) 5 MG tablet Take 5 mg by mouth daily.     No current facility-administered medications on file prior to visit.     Allergies  Allergen Reactions  . Eggs Or Egg-Derived Products Rash    Social History   Socioeconomic History  . Marital status: Single    Spouse name: Not on file  . Number of children: 6  . Years of education: 8 th grade  . Highest education level: Not on file  Occupational History  . Not on file  Social Needs  . Financial resource strain: Not on file  . Food insecurity:    Worry: Not on file    Inability: Not on file  . Transportation needs:    Medical: Not on file    Non-medical: Not on file  Tobacco Use  . Smoking status: Current Every Day Smoker    Packs/day: 1.00    Years: 20.00    Pack years: 20.00    Types: Cigarettes  .  Smokeless tobacco: Current User  Substance and Sexual Activity  . Alcohol use: No  . Drug use: Yes    Frequency: 2.0 times per week    Types: Marijuana, Benzodiazepines, Other-see comments    Comment: adderall  . Sexual activity: Yes  Lifestyle  . Physical activity:    Days per week: Not on file    Minutes per session: Not on file  . Stress: Not on file  Relationships  . Social connections:    Talks on phone: Not on file    Gets together: Not on file    Attends religious service: Not on file    Active member of club or organization: Not on file    Attends meetings of clubs or organizations: Not on file    Relationship status: Not on file  . Intimate partner violence:    Fear of current or ex partner: Not on file    Emotionally abused: Not on file    Physically abused: Not on file    Forced sexual activity: Not on file  Other Topics Concern  . Not on file  Social History Narrative  . Not on file     Family History  Problem Relation Age of Onset  . Hypertension Mother   . Diabetes Mother   . Hypertension Father     Past Surgical History:  Procedure Laterality Date  . ANKLE SURGERY     right  . GROIN DISSECTION  10/31/2011   Procedure: Drucie Ip EXPLORATION;  Surgeon: Fabio Bering, MD;  Location: [facility];  Service: General;  Laterality: Left;  . INGUINAL HERNIA REPAIR  04/27/2011   Procedure: HERNIA REPAIR INGUINAL ADULT;  Surgeon: Fabio Bering, MD;  Location: [facility];  Service: General;  Laterality: Left;  Left Inguinal Hernia Repair with Mesh Plug  . lung removed     left lung d/t  cysts per patient  . MASS EXCISION Left 11/19/2014   Procedure: EXCISION SUTURE GRANULOMA LEFT GROIN;  Surgeon: Franky Macho, MD;  Location: [facility];  Service: General;  Laterality: Left;  . STERIOD INJECTION  08/05/2011   Procedure: MINOR STEROID INJECTION;  Surgeon: Fabio Bering, MD;  Location: [facility];  Service: General;  Laterality: N/A;  In Minor Room  . STERIOD INJECTION  09/02/2011   Procedure: MINOR STEROID INJECTION;  Surgeon: Fabio Bering, MD;  Location: [facility];  Service: General;  Laterality: N/A;    ROS: Review of Systems Negative except as stated above  PHYSICAL EXAM: BP 133/75 (BP Location: Left Arm, Patient Position: Sitting, Cuff Size: Normal)   Pulse 67   Temp 98.2 F (36.8 C) (Oral)   Resp 18   Ht 5\' 7"  (1.702 m)   Wt 132 lb (59.9 kg)   SpO2 95%   BMI 20.67 kg/m   Wt Readings from Last 3 Encounters:  06/08/18 132 lb (59.9 kg)  11/19/14 129 lb (58.5 kg)  11/14/14 129 lb (58.5 kg)    Physical Exam  General appearance - alert, well appearing, middle-aged Caucasian male and in no distress Mental status - normal mood, behavior, speech, dress, motor activity, and thought processes.  Patient noted to constantly shake both legs Eyes -nonicteric sclera Nose - normal and patent, no erythema, discharge or polyps Mouth - mucous membranes moist, pharynx normal without lesions.   He has dentures above Neck -no thyroid enlargement or thyroid nodules palpated.  No cervical lymphadenopathy Chest - clear to auscultation, no wheezes, rales or rhonchi, symmetric air entry Heart -  normal rate, regular rhythm, normal S1, S2, no murmurs, rubs, clicks or gallops Extremities - peripheral pulses normal, no pedal edema, no clubbing or cyanosis  GAD 7 : Generalized Anxiety Score 06/08/2018  Nervous, Anxious, on Edge 0  Control/stop worrying 0  Worry too much - different things 3  Trouble relaxing 3  Restless 0  Easily annoyed or irritable 1  Afraid - awful might happen 0  Total GAD 7 Score 7    Depression screen PHQ 2/9 06/08/2018  Decreased Interest 1  Down, Depressed, Hopeless 0  PHQ - 2 Score 1  Altered sleeping 2  Tired, decreased energy 1  Change in appetite 1  Feeling bad or failure about yourself  0  Trouble concentrating 3  Moving slowly or fidgety/restless 0  Suicidal thoughts 0  PHQ-9 Score 8    CMP Latest Ref Rng & Units 06/09/2016 04/28/2016 11/14/2014  Glucose 65 - 99 mg/dL 098(J) 96 191(Y)  BUN 6 - 20 mg/dL Creatinine 0.61 - 1.24 mg/dL 7.82 9.56 2.13  Sodium 135 - 145 mmol/L 137 139 138  Potassium 3.5 - 5.1 mmol/L 3.9 3.8 4.2  Chloride 101 - 111 mmol/L 106 106 105  CO2 22 - 32 mmol/L 23 - 25  Calcium 8.9 - 10.3 mg/dL 9.3 - 9.0  Total Protein 6.5 - 8.1 g/dL 6.5 - -  Total Bilirubin 0.3 - 1.2 mg/dL 0.5 - -  Alkaline Phos 38 - 126 U/L 69 - -  AST 15 - 41 U/L 26 - -  ALT 17 - 63 U/L 13(L) - -   Lipid Panel  No results found for: CHOL, TRIG, HDL, CHOLHDL, VLDL, LDLCALC, LDLDIRECT  CBC    Component Value Date/Time   WBC 10.4 06/09/2016 1833   RBC 5.14 06/09/2016 1833   HGB 16.6 06/09/2016 1833   HCT 47.7 06/09/2016 1833   PLT 208 06/09/2016 1833   MCV 92.8 06/09/2016 1833   MCH 32.3 06/09/2016 1833   MCHC 34.8 06/09/2016 1833   RDW 12.9 06/09/2016 1833   LYMPHSABS 1.9 11/14/2014 1035   MONOABS 0.6 11/14/2014 1035   EOSABS 0.1  11/14/2014 1035   BASOSABS 0.1 11/14/2014 1035    ASSESSMENT AND PLAN: 1. Generalized anxiety disorder -Patient is wanting to be placed on clonazepam or Xanax. Patient advised of my concern about his aberrant behavior of missed use of prescribed clonazepam and history of purchasing controlled substances on the street.  Advised against use of controlled substances and consider getting into a treatment program.  We will set up an appointment with our LCSW to be informed of treatment programs in the area. -In regards to his anxiety we can put him on BuSpar. -We will refer him to psychiatry. - busPIRone (BUSPAR) 5 MG tablet; Take 1 tablet (5 mg total) by mouth 2 (two) times daily.  Dispense: 60 tablet; Refill: 1 - Ambulatory referral to Psychiatry  2. History of hyperthyroidism He has been off methimazole for 2 months.  I question whether he does have true hyperthyroidism or whether he was taking it for weight loss.  We will check a thyroid panel first to see whether methimazole is indicated - TSH+T4F+T3Free  3. History of ADHD I declined prescribing Adderall for reasons listed in #1 above. 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 Tobacco dependence Patient advised to quit smoking. Discussed health risks associated with smoking including lung and other types of cancers, chronic  lung diseases and CV risks.. Pt ready to give trail of quitting.  We discussed methods to help him quit including the nicotine patches which he is not wanting to try.  Encouraged him to try to cut back gradually and set a quit date less than 5 minutes spent on counseling.   Patient was given the opportunity to ask questions.  Patient verbalized understanding of the plan and was able to repeat key elements of the plan.  I spent 30 minutes with this patient in direct face-to-face evaluation and coordination of care. Orders Placed This Encounter  Procedures  .  TSH+T4F+T3Free  . Ambulatory referral to Psychiatry     Requested Prescriptions   Signed Prescriptions Disp Refills  . busPIRone (BUSPAR) 5 MG tablet 60 tablet 1    Sig: Take 1 tablet (5 mg total) by mouth 2 (two) times daily.    Return in about 2 months (around 08/08/2018).  Jonah Blueeborah Johnson, MD, FACP

## 2018-06-08 NOTE — Patient Instructions (Signed)
Please give patient a telephone encounter visit with our LCSW.

## 2018-06-09 LAB — TSH+T4F+T3FREE
Free T4: 1.1 ng/dL (ref 0.82–1.77)
T3, Free: 4.2 pg/mL (ref 2.0–4.4)
TSH: 1.32 u[IU]/mL (ref 0.450–4.500)

## 2021-07-29 DIAGNOSIS — M545 Low back pain, unspecified: Secondary | ICD-10-CM | POA: Diagnosis not present

## 2021-08-05 DIAGNOSIS — M545 Low back pain, unspecified: Secondary | ICD-10-CM | POA: Diagnosis not present

## 2023-11-09 ENCOUNTER — Ambulatory Visit: Payer: Self-pay

## 2023-11-09 NOTE — Telephone Encounter (Signed)
 FYI Only or Action Required?: FYI only for provider.  Patient was last seen in primary care on NA.  Called Nurse Triage reporting Rectal Bleeding.  Symptoms began several weeks ago.  Interventions attempted: Rest, hydration, or home remedies.  Symptoms are: gradually worsening.  Triage Disposition: Go to ED Now (Notify PCP)  Patient/caregiver understands and will follow disposition?: No, refuses disposition  Copied from CRM [id number]. Topic: Clinical - Red Word Triage >> Nov 09, 2023  2:29 PM Bradley White wrote: Red Word that prompted transfer to Nurse Triage: Rectal Bleeding Reason for Disposition  [1] MODERATE rectal bleeding (e.g., small blood clots, passing blood without stool, or toilet water turns red) AND [2] more than once a day  Answer Assessment - Initial Assessment Questions History of polyps on the outside.  Everytime patient uses the bathroom he is bleeding, and sometimes without. He's wanting a sooner New Pt appt to assess. Advised that appt on waitlist and that he would need to be seen in ED or UC to assess as the MD office doesn't have the tools to treat the bleeding or any blood loss. He understands and states he will wait until an appt comes up. Attempted to make UC appt for tomorrow. Patient states he will walk in tomorrow for assessment. Uncomfortable with dealing with rectal issues.   1. APPEARANCE of BLOOD: What color is it? Is it passed separately, on the surface of the stool, or mixed in with the stool?      Bright red blood  2. AMOUNT: How much blood was passed?      Bleed all day sometimes, he usually has to change his underwear between bathroom visits.  3. FREQUENCY: How many times has blood been passed with the stools?      Every time he uses the bathroom  4. ONSET: When was the blood first seen in the stools? (Days or weeks)      About 6 months ago  5. DIARRHEA: Is there also some diarrhea? If Yes, ask: How many diarrhea stools in the past 24  hours?      denies 6. CONSTIPATION: Do you have constipation? If Yes, ask: How bad is it?     denies 7. RECURRENT SYMPTOMS: Have you had blood in your stools before? If Yes, ask: When was the last time? and What happened that time?      History of polyps 8. BLOOD THINNERS: Do you take any blood thinners? (e.g., aspirin, clopidogrel / Plavix, coumadin, heparin). Notes: Other strong blood thinners include: Arixtra (fondaparinux), Eliquis (apixaban), Pradaxa (dabigatran), and Xarelto (rivaroxaban).     Denies  9. OTHER SYMPTOMS: Do you have any other symptoms?  (e.g., abdomen pain, vomiting, dizziness, fever)     denies  Protocols used: Rectal Bleeding-A-AH

## 2024-02-22 ENCOUNTER — Encounter: Payer: Self-pay | Admitting: Family Medicine

## 2024-02-22 ENCOUNTER — Ambulatory Visit: Admitting: Family Medicine

## 2024-02-22 VITALS — BP 123/82 | HR 62 | Temp 97.6°F | Ht 67.0 in | Wt 165.2 lb

## 2024-02-22 DIAGNOSIS — Z13220 Encounter for screening for lipoid disorders: Secondary | ICD-10-CM

## 2024-02-22 DIAGNOSIS — Z1159 Encounter for screening for other viral diseases: Secondary | ICD-10-CM

## 2024-02-22 DIAGNOSIS — F17210 Nicotine dependence, cigarettes, uncomplicated: Secondary | ICD-10-CM | POA: Insufficient documentation

## 2024-02-22 DIAGNOSIS — K6289 Other specified diseases of anus and rectum: Secondary | ICD-10-CM | POA: Insufficient documentation

## 2024-02-22 DIAGNOSIS — Z0000 Encounter for general adult medical examination without abnormal findings: Secondary | ICD-10-CM | POA: Insufficient documentation

## 2024-02-22 DIAGNOSIS — Z1321 Encounter for screening for nutritional disorder: Secondary | ICD-10-CM

## 2024-02-22 DIAGNOSIS — H538 Other visual disturbances: Secondary | ICD-10-CM | POA: Insufficient documentation

## 2024-02-22 DIAGNOSIS — F909 Attention-deficit hyperactivity disorder, unspecified type: Secondary | ICD-10-CM | POA: Insufficient documentation

## 2024-02-22 DIAGNOSIS — Z1329 Encounter for screening for other suspected endocrine disorder: Secondary | ICD-10-CM

## 2024-02-22 DIAGNOSIS — Z1211 Encounter for screening for malignant neoplasm of colon: Secondary | ICD-10-CM

## 2024-02-22 DIAGNOSIS — Z114 Encounter for screening for human immunodeficiency virus [HIV]: Secondary | ICD-10-CM

## 2024-02-22 DIAGNOSIS — F411 Generalized anxiety disorder: Secondary | ICD-10-CM | POA: Insufficient documentation

## 2024-02-22 MED ORDER — BUSPIRONE HCL 5 MG PO TABS: 5.0000 mg | ORAL_TABLET | Freq: Two times a day (BID) | ORAL | 1 refills | Status: DC

## 2024-02-22 MED ORDER — BUPROPION HCL ER (SR) 150 MG PO TB12: 150.0000 mg | ORAL_TABLET | Freq: Two times a day (BID) | ORAL | 1 refills | Status: AC
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
## 2024-02-22 NOTE — Patient Instructions (Signed)
 It was great to meet you today and I'm excited to have you join the [facility] practice. I hope you had a positive experience today! If you feel so inclined, please feel free to recommend our practice to friends and family. Jeoffrey Barrio, FNP-C

## 2024-02-23 LAB — TSH: TSH: 0.87 m[IU]/L (ref 0.40–4.50)

## 2024-02-23 LAB — COMPREHENSIVE METABOLIC PANEL WITH GFR
AG Ratio: 1.9 (calc) (ref 1.0–2.5)
ALT: 22 U/L (ref 9–46)
AST: 23 U/L (ref 10–40)
Albumin: 4.4 g/dL (ref 3.6–5.1)
Alkaline phosphatase (APISO): 73 U/L (ref 36–130)
BUN: 12 mg/dL (ref 7–25)
CO2: 26 mmol/L (ref 20–32)
Calcium: 9.2 mg/dL (ref 8.6–10.3)
Chloride: 106 mmol/L (ref 98–110)
Creat: 0.95 mg/dL (ref 0.60–1.29)
Globulin: 2.3 g/dL (ref 1.9–3.7)
Glucose, Bld: 88 mg/dL (ref 65–99)
Potassium: 4.3 mmol/L (ref 3.5–5.3)
Sodium: 139 mmol/L (ref 135–146)
Total Bilirubin: 0.6 mg/dL (ref 0.2–1.2)
Total Protein: 6.7 g/dL (ref 6.1–8.1)
eGFR: 99 mL/min/{1.73_m2}

## 2024-02-23 LAB — CBC WITH DIFFERENTIAL/PLATELET
Absolute Lymphocytes: 1877 {cells}/uL (ref 850–3900)
Absolute Monocytes: 760 {cells}/uL (ref 200–950)
Basophils Absolute: 68 {cells}/uL (ref 0–200)
Basophils Relative: 0.9 %
Eosinophils Absolute: 122 {cells}/uL (ref 15–500)
Eosinophils Relative: 1.6 %
HCT: 49.3 % (ref 39.4–51.1)
Hemoglobin: 16.9 g/dL (ref 13.2–17.1)
MCH: 31 pg (ref 27.0–33.0)
MCHC: 34.3 g/dL (ref 31.6–35.4)
MCV: 90.5 fL (ref 81.4–101.7)
MPV: 10.2 fL (ref 7.5–12.5)
Monocytes Relative: 10 %
Neutro Abs: 4773 {cells}/uL (ref 1500–7800)
Neutrophils Relative %: 62.8 %
Platelets: 235 10*3/uL (ref 140–400)
RBC: 5.45 Million/uL (ref 4.20–5.80)
RDW: 12 % (ref 11.0–15.0)
Total Lymphocyte: 24.7 %
WBC: 7.6 10*3/uL (ref 3.8–10.8)

## 2024-02-23 LAB — HEPATITIS C ANTIBODY: Hepatitis C Ab: NONREACTIVE

## 2024-02-23 LAB — HEMOGLOBIN A1C
Hgb A1c MFr Bld: 5.3 %
Mean Plasma Glucose: 105 mg/dL
eAG (mmol/L): 5.8 mmol/L

## 2024-02-23 LAB — LIPID PANEL
Cholesterol: 151 mg/dL
HDL: 45 mg/dL
LDL Cholesterol (Calc): 89 mg/dL
Non-HDL Cholesterol (Calc): 106 mg/dL
Total CHOL/HDL Ratio: 3.4 (calc)
Triglycerides: 83 mg/dL

## 2024-02-23 LAB — HIV ANTIBODY (ROUTINE TESTING W REFLEX)
HIV 1&2 Ab, 4th Generation: NONREACTIVE
HIV FINAL INTERPRETATION: NEGATIVE

## 2024-02-23 LAB — VITAMIN D 25 HYDROXY (VIT D DEFICIENCY, FRACTURES): Vit D, 25-Hydroxy: 15 ng/mL — ABNORMAL LOW (ref 30–100)

## 2024-04-04 ENCOUNTER — Ambulatory Visit: Admitting: Family Medicine
